# Patient Record
Sex: Male | Born: 1950 | Race: White | Hispanic: No | Marital: Married | Smoking: Former smoker
Health system: Southern US, Community
[De-identification: ages and names within clinical notes are randomized; demographics above are authoritative.]

## PROBLEM LIST (undated history)

## (undated) DIAGNOSIS — E119 Type 2 diabetes mellitus without complications: Secondary | ICD-10-CM

## (undated) DIAGNOSIS — K635 Polyp of colon: Secondary | ICD-10-CM

## (undated) DIAGNOSIS — Q613 Polycystic kidney, unspecified: Secondary | ICD-10-CM

## (undated) DIAGNOSIS — N189 Chronic kidney disease, unspecified: Secondary | ICD-10-CM

## (undated) DIAGNOSIS — K439 Ventral hernia without obstruction or gangrene: Secondary | ICD-10-CM

## (undated) DIAGNOSIS — C189 Malignant neoplasm of colon, unspecified: Secondary | ICD-10-CM

## (undated) DIAGNOSIS — I2510 Atherosclerotic heart disease of native coronary artery without angina pectoris: Secondary | ICD-10-CM

## (undated) DIAGNOSIS — I10 Essential (primary) hypertension: Secondary | ICD-10-CM

## (undated) DIAGNOSIS — E785 Hyperlipidemia, unspecified: Secondary | ICD-10-CM

## (undated) HISTORY — DX: Polycystic kidney, unspecified: Q61.3

## (undated) HISTORY — PX: TONSILLECTOMY: SUR1361

## (undated) HISTORY — PX: CORONARY ANGIOPLASTY: SHX604

## (undated) HISTORY — DX: Polyp of colon: K63.5

## (undated) HISTORY — DX: Malignant neoplasm of colon, unspecified: C18.9

## (undated) HISTORY — PX: FRACTURE SURGERY: SHX138

---

## 1997-10-01 HISTORY — PX: HERNIA REPAIR: SHX51

## 2006-06-23 ENCOUNTER — Other Ambulatory Visit: Payer: Self-pay

## 2006-06-23 ENCOUNTER — Inpatient Hospital Stay: Payer: Self-pay | Admitting: Internal Medicine

## 2007-10-31 ENCOUNTER — Other Ambulatory Visit: Payer: Self-pay

## 2007-10-31 ENCOUNTER — Emergency Department: Payer: Self-pay | Admitting: Emergency Medicine

## 2007-11-05 ENCOUNTER — Ambulatory Visit: Payer: Self-pay | Admitting: Internal Medicine

## 2008-10-01 HISTORY — PX: CORONARY ARTERY BYPASS GRAFT: SHX141

## 2008-10-03 ENCOUNTER — Inpatient Hospital Stay: Payer: Self-pay | Admitting: Internal Medicine

## 2010-11-13 ENCOUNTER — Ambulatory Visit: Payer: Self-pay | Admitting: Internal Medicine

## 2011-05-15 ENCOUNTER — Ambulatory Visit (HOSPITAL_BASED_OUTPATIENT_CLINIC_OR_DEPARTMENT_OTHER)
Admission: RE | Admit: 2011-05-15 | Discharge: 2011-05-15 | Disposition: A | Discharge status: Home / Self Care | Payer: Federal, State, Local not specified - PPO | Source: Ambulatory Visit | Attending: Orthopedic Surgery | Admitting: Orthopedic Surgery

## 2011-05-15 DIAGNOSIS — M23329 Other meniscus derangements, posterior horn of medial meniscus, unspecified knee: Secondary | ICD-10-CM | POA: Insufficient documentation

## 2011-05-15 DIAGNOSIS — E119 Type 2 diabetes mellitus without complications: Secondary | ICD-10-CM | POA: Insufficient documentation

## 2011-05-15 DIAGNOSIS — I10 Essential (primary) hypertension: Secondary | ICD-10-CM | POA: Insufficient documentation

## 2011-05-15 DIAGNOSIS — Z01812 Encounter for preprocedural laboratory examination: Secondary | ICD-10-CM | POA: Insufficient documentation

## 2011-05-15 DIAGNOSIS — M23302 Other meniscus derangements, unspecified lateral meniscus, unspecified knee: Secondary | ICD-10-CM | POA: Insufficient documentation

## 2011-05-15 LAB — POCT I-STAT, CHEM 8
BUN: 28 mg/dL — ABNORMAL HIGH (ref 6–23)
Chloride: 110 mEq/L (ref 96–112)
Creatinine, Ser: 1.6 mg/dL — ABNORMAL HIGH (ref 0.50–1.35)
Potassium: 3.9 mEq/L (ref 3.5–5.1)
Sodium: 139 mEq/L (ref 135–145)

## 2011-05-16 NOTE — Op Note (Signed)
NAMEALAZAR, PLONA             ACCOUNT NO.:  [id number]  MEDICAL RECORD NO.:  [id number]  LOCATION:                                 FACILITY:  PHYSICIAN:  Tamon Parkerson A. Noemi Chapel, M.D. DATE OF BIRTH:  12/07/1950  DATE OF PROCEDURE:  05/15/2011 DATE OF DISCHARGE:                              OPERATIVE REPORT   PREOPERATIVE DIAGNOSES: 1. Right knee medial and lateral meniscal tears with chondromalacia. 2. Right knee medial femoral condyle insufficiency fracture.  POSTOPERATIVE DIAGNOSES: 1. Right knee medial and lateral meniscal tears with chondromalacia. 2. Right knee medial femoral condyle insufficiency fracture.  PROCEDURE:  Right knee EUA followed by arthroscopic partial medial and lateral meniscectomies with chondroplasty.  SURGEON:  Audree Camel. Noemi Chapel, MD  ANESTHESIA:  General.  OPERATIVE TIME:  30 minutes.  COMPLICATIONS:  None.  INDICATIONS FOR PROCEDURE:  Mr. Klein is a 60 year old gentleman who has had 6-8 months of increasing right knee pain with exam and MRI documenting meniscal tearing with chondromalacia and medial femoral condyle insufficiency fracture.  He has failed conservative care and is now to undergo arthroscopy.  DESCRIPTION:  Mr. Mccowan was brought to the operating room on May 15, 2011, after knee block had been placed in the holding by Anesthesia. He was placed on the operative table in supine position.  He received Ancef 2 grams IV preoperatively for prophylaxis.  After being placed under general anesthesia, his right knee was examined.  He had full range of motion.  Knee was stable, ligamentous exam with normal patellar tracking.  Right leg was prepped using sterile DuraPrep and draped using sterile technique.  Time-out procedure was called and the correct right knee identified.  Initially through an anterolateral portal, the arthroscope with a pump attached was placed into an anteromedial portal and arthroscopic probe was placed.  On initial  inspection of medial compartment, he was found to have 75% grade 3 chondromalacia on the medial femoral condyle, which was debrided.  Medial tibial plateau showed grade 1 and 2 changes.  Medial meniscus showed tearing of the posterior medial horn of which 30-40% was resected back to a stable rim. Intercondylar notch was inspected.  Anterior and posterior cruciate ligaments were normal.  Lateral compartment inspected.  He had grade 1 and 2 chondromalacia.  Lateral meniscus showed 20% tear at posterolateral corner, which was resected back to a stable rim. Patellofemoral joint showed 75% grade 3 chondromalacia on the femoral groove, which was debrided.  50% grade 3 changes on the patella, which was debrided.  The patella tracked normally.  Moderate synovitis, medial lateral gutters were debrided, otherwise this was free of pathology. After this was done, it was felt that all pathology had been satisfactorily addressed.  The instruments were removed.  Portals were closed with 3-0 nylon suture.  Sterile dressings were applied and the patient awakened and taken to recovery in stable condition.  FOLLOWUP CARE:  Mr. Salaz will be followed as an outpatient on Norco for pain, touchdown weightbearing.  Seen back in the office in a week for sutures out and followup.     Brookie Wayment A. Noemi Chapel, M.D.     RAW/MEDQ  D:  05/16/2011  T:  05/16/2011  Job:  [id number]  Electronically Signed by Elsie Saas M.D. on 05/16/2011 04:54:34 PM

## 2011-11-14 ENCOUNTER — Ambulatory Visit: Payer: Self-pay | Admitting: Orthopedic Surgery

## 2012-10-01 HISTORY — PX: MENISCUS REPAIR: SHX5179

## 2013-08-10 DIAGNOSIS — I10 Essential (primary) hypertension: Secondary | ICD-10-CM | POA: Insufficient documentation

## 2013-08-10 DIAGNOSIS — N184 Chronic kidney disease, stage 4 (severe): Secondary | ICD-10-CM | POA: Insufficient documentation

## 2013-08-10 DIAGNOSIS — N186 End stage renal disease: Secondary | ICD-10-CM | POA: Insufficient documentation

## 2013-08-10 DIAGNOSIS — R809 Proteinuria, unspecified: Secondary | ICD-10-CM | POA: Insufficient documentation

## 2013-08-10 DIAGNOSIS — E1169 Type 2 diabetes mellitus with other specified complication: Secondary | ICD-10-CM | POA: Insufficient documentation

## 2013-08-10 DIAGNOSIS — E1129 Type 2 diabetes mellitus with other diabetic kidney complication: Secondary | ICD-10-CM | POA: Insufficient documentation

## 2013-09-21 DIAGNOSIS — Q612 Polycystic kidney, adult type: Secondary | ICD-10-CM | POA: Insufficient documentation

## 2014-08-23 DIAGNOSIS — N200 Calculus of kidney: Secondary | ICD-10-CM | POA: Insufficient documentation

## 2014-10-11 DIAGNOSIS — I2510 Atherosclerotic heart disease of native coronary artery without angina pectoris: Secondary | ICD-10-CM | POA: Insufficient documentation

## 2015-12-06 DIAGNOSIS — I7772 Dissection of iliac artery: Secondary | ICD-10-CM | POA: Insufficient documentation

## 2015-12-13 DIAGNOSIS — Z1211 Encounter for screening for malignant neoplasm of colon: Secondary | ICD-10-CM | POA: Insufficient documentation

## 2016-02-06 ENCOUNTER — Ambulatory Visit: Payer: Federal, State, Local not specified - PPO | Admitting: Anesthesiology

## 2016-02-06 ENCOUNTER — Ambulatory Visit
Admission: RE | Admit: 2016-02-06 | Discharge: 2016-02-06 | Disposition: A | Discharge status: Home Health | Payer: Federal, State, Local not specified - PPO | Source: Ambulatory Visit | Attending: Gastroenterology | Admitting: Gastroenterology

## 2016-02-06 ENCOUNTER — Encounter
Admission: RE | Disposition: A | Discharge status: Home Health | Payer: Self-pay | Source: Ambulatory Visit | Attending: Gastroenterology

## 2016-02-06 DIAGNOSIS — E1122 Type 2 diabetes mellitus with diabetic chronic kidney disease: Secondary | ICD-10-CM | POA: Insufficient documentation

## 2016-02-06 DIAGNOSIS — Z7982 Long term (current) use of aspirin: Secondary | ICD-10-CM | POA: Diagnosis not present

## 2016-02-06 DIAGNOSIS — K635 Polyp of colon: Secondary | ICD-10-CM

## 2016-02-06 DIAGNOSIS — Z79899 Other long term (current) drug therapy: Secondary | ICD-10-CM | POA: Diagnosis not present

## 2016-02-06 DIAGNOSIS — Z7902 Long term (current) use of antithrombotics/antiplatelets: Secondary | ICD-10-CM | POA: Diagnosis not present

## 2016-02-06 DIAGNOSIS — K5730 Diverticulosis of large intestine without perforation or abscess without bleeding: Secondary | ICD-10-CM | POA: Insufficient documentation

## 2016-02-06 DIAGNOSIS — I129 Hypertensive chronic kidney disease with stage 1 through stage 4 chronic kidney disease, or unspecified chronic kidney disease: Secondary | ICD-10-CM | POA: Insufficient documentation

## 2016-02-06 DIAGNOSIS — C189 Malignant neoplasm of colon, unspecified: Secondary | ICD-10-CM | POA: Diagnosis not present

## 2016-02-06 DIAGNOSIS — D125 Benign neoplasm of sigmoid colon: Secondary | ICD-10-CM | POA: Insufficient documentation

## 2016-02-06 DIAGNOSIS — Z794 Long term (current) use of insulin: Secondary | ICD-10-CM | POA: Insufficient documentation

## 2016-02-06 DIAGNOSIS — Z1211 Encounter for screening for malignant neoplasm of colon: Secondary | ICD-10-CM | POA: Insufficient documentation

## 2016-02-06 DIAGNOSIS — D124 Benign neoplasm of descending colon: Secondary | ICD-10-CM | POA: Insufficient documentation

## 2016-02-06 DIAGNOSIS — E785 Hyperlipidemia, unspecified: Secondary | ICD-10-CM | POA: Insufficient documentation

## 2016-02-06 DIAGNOSIS — D123 Benign neoplasm of transverse colon: Secondary | ICD-10-CM | POA: Diagnosis not present

## 2016-02-06 DIAGNOSIS — N189 Chronic kidney disease, unspecified: Secondary | ICD-10-CM | POA: Insufficient documentation

## 2016-02-06 DIAGNOSIS — I2510 Atherosclerotic heart disease of native coronary artery without angina pectoris: Secondary | ICD-10-CM | POA: Diagnosis not present

## 2016-02-06 HISTORY — DX: Atherosclerotic heart disease of native coronary artery without angina pectoris: I25.10

## 2016-02-06 HISTORY — PX: COLONOSCOPY WITH PROPOFOL: SHX5780

## 2016-02-06 HISTORY — DX: Hyperlipidemia, unspecified: E78.5

## 2016-02-06 HISTORY — DX: Type 2 diabetes mellitus without complications: E11.9

## 2016-02-06 HISTORY — DX: Malignant neoplasm of colon, unspecified: C18.9

## 2016-02-06 HISTORY — DX: Chronic kidney disease, unspecified: N18.9

## 2016-02-06 HISTORY — DX: Polyp of colon: K63.5

## 2016-02-06 HISTORY — DX: Essential (primary) hypertension: I10

## 2016-02-06 LAB — GLUCOSE, CAPILLARY: GLUCOSE-CAPILLARY: 109 mg/dL — AB (ref 65–99)

## 2016-02-06 SURGERY — COLONOSCOPY WITH PROPOFOL
Anesthesia: General

## 2016-02-06 MED ORDER — SODIUM CHLORIDE 0.9 % IV SOLN
INTRAVENOUS | Status: DC
  Administered 2016-02-06: 1000 mL via INTRAVENOUS
  Administered 2016-02-06: 12:00:00 via INTRAVENOUS

## 2016-02-06 MED ORDER — MIDAZOLAM HCL 5 MG/5ML IJ SOLN
INTRAMUSCULAR | Status: DC | PRN
  Administered 2016-02-06: 2 mg via INTRAVENOUS

## 2016-02-06 MED ORDER — PROPOFOL 500 MG/50ML IV EMUL
INTRAVENOUS | Status: DC | PRN
  Administered 2016-02-06: 200 ug/kg/min via INTRAVENOUS

## 2016-02-06 MED ORDER — SPOT INK MARKER SYRINGE KIT
PACK | SUBMUCOSAL | Status: DC | PRN
  Administered 2016-02-06: 5 mL via SUBMUCOSAL

## 2016-02-06 MED ORDER — SODIUM CHLORIDE 0.9 % IV SOLN: INTRAVENOUS | Status: DC

## 2016-02-06 MED ORDER — PROPOFOL 10 MG/ML IV BOLUS
INTRAVENOUS | Status: DC | PRN
  Administered 2016-02-06: 50 mg via INTRAVENOUS

## 2016-02-06 MED ORDER — FENTANYL CITRATE (PF) 100 MCG/2ML IJ SOLN
INTRAMUSCULAR | Status: DC | PRN
  Administered 2016-02-06: 50 ug via INTRAVENOUS

## 2016-02-06 NOTE — Anesthesia Postprocedure Evaluation (Signed)
Anesthesia Post Note  Patient: Tyrone Campbell  Procedure(s) Performed: Procedure(s) (LRB): COLONOSCOPY WITH PROPOFOL (N/A)  Patient location during evaluation: Endoscopy Anesthesia Type: General Level of consciousness: awake and alert Pain management: pain level controlled Vital Signs Assessment: post-procedure vital signs reviewed and stable Respiratory status: spontaneous breathing, nonlabored ventilation, respiratory function stable and patient connected to nasal cannula oxygen Cardiovascular status: blood pressure returned to baseline and stable Postop Assessment: no signs of nausea or vomiting Anesthetic complications: no    Last Vitals:  Filed Vitals:   02/06/16 1318 02/06/16 1328  BP: 159/84 186/96  Pulse: 57 51  Temp:    Resp: 19 24    Last Pain: There were no vitals filed for this visit.               Precious Haws Jaken Fregia

## 2016-02-06 NOTE — H&P (Signed)
Outpatient short stay form Pre-procedure 02/06/2016 11:41 AM Lollie Sails MD  Primary Physician: Dr. [name]   Reason for visit:   Colonoscopy  History of present illness:  Patient is a 65 year old male presenting today for screening colonoscopy. Since his first colonoscopy. He does have a history of taking Plavix and aspirin. He has discontinued both of those for about 5 days. He tolerated his prep well. There is no family history of colon polyps or colon cancer. He takes no other blood thinning agents or aspirin products. He does have a history of coronary artery disease with coronary artery stenting as well as polycystic kidney disease.     Current facility-administered medications:  .  0.9 %  sodium chloride infusion, , Intravenous, Continuous, Lollie Sails, MD, Last Rate: 20 mL/hr at 02/06/16 1104, 1,000 mL at 02/06/16 1104 .  0.9 %  sodium chloride infusion, , Intravenous, Continuous, Lollie Sails, MD  Prescriptions prior to admission  Medication Sig Dispense Refill Last Dose  . aspirin 81 MG tablet Take 81 mg by mouth daily.     . carvedilol (COREG) 25 MG tablet Take 25 mg by mouth 2 (two) times daily with a meal.     . cloNIDine (CATAPRES) 0.2 MG tablet Take 0.2 mg by mouth 2 (two) times daily.     . clopidogrel (PLAVIX) 75 MG tablet Take 75 mg by mouth daily.   02/01/2016 at Unknown time  . furosemide (LASIX) 20 MG tablet Take 20 mg by mouth.     Marland Kitchen glipiZIDE (GLUCOTROL) 10 MG tablet Take 20 mg by mouth 2 (two) times daily before a meal.     . insulin glargine (LANTUS) 100 UNIT/ML injection Inject 26 Units into the skin at bedtime.     . isosorbide mononitrate (IMDUR) 120 MG 24 hr tablet Take 120 mg by mouth daily.     Marland Kitchen linagliptin (TRADJENTA) 5 MG TABS tablet Take 5 mg by mouth daily.     . ramipril (ALTACE) 10 MG capsule Take 10 mg by mouth 2 (two) times daily.     . simvastatin (ZOCOR) 20 MG tablet Take 20 mg by mouth daily.        No Known  Allergies   Past Medical History  Diagnosis Date  . Coronary artery disease   . Diabetes mellitus without complication ([place])   . Hypertension   . Hyperlipemia   . Chronic kidney disease     Review of systems:      Physical Exam    Heart and lungs: Regular rate and rhythm without rub or gallop, lungs are bilaterally clear.    HEENT: Normocephalic atraumatic eyes are anicteric    Other:     Pertinant exam for procedure: Soft nontender nondistended bowel sounds are positive and normoactive.    Planned proceedures: Colonoscopy and indicated procedures. I have discussed the risks benefits and complications of procedures to include not limited to bleeding, infection, perforation and the risk of sedation and the patient wishes to proceed.    Lollie Sails, MD Gastroenterology 02/06/2016  11:41 AM

## 2016-02-06 NOTE — Anesthesia Procedure Notes (Addendum)
Date/Time: 02/06/2016 11:55 AM Performed by: Allean Found Pre-anesthesia Checklist: Patient identified, Emergency Drugs available, Suction available, Patient being monitored and Timeout performed Patient Re-evaluated:Patient Re-evaluated prior to inductionOxygen Delivery Method: Nasal cannula Intubation Type: IV induction   Performed by: Starlee Corralejo Ventilation: Nasal airway inserted- appropriate to patient size

## 2016-02-06 NOTE — Transfer of Care (Signed)
Immediate Anesthesia Transfer of Care Note  Patient: Tyrone Campbell  Procedure(s) Performed: Procedure(s): COLONOSCOPY WITH PROPOFOL (N/A)  Patient Location: PACU  Anesthesia Type:General  Level of Consciousness: awake  Airway & Oxygen Therapy: Patient Spontanous Breathing and Patient connected to nasal cannula oxygen  Post-op Assessment: Report given to RN and Post -op Vital signs reviewed and stable  Post vital signs: Reviewed and stable  Last Vitals:  Filed Vitals:   02/06/16 1044 02/06/16 1258  BP: 208/90   Pulse: 56 73  Temp: 36.4 C 36.2 C  Resp: 18     Last Pain: There were no vitals filed for this visit.       Complications: No apparent anesthesia complications

## 2016-02-06 NOTE — Anesthesia Preprocedure Evaluation (Signed)
Anesthesia Evaluation  Patient identified by MRN, date of birth, ID band Patient awake    Reviewed: Allergy & Precautions, H&P , NPO status , Patient's Chart, lab work & pertinent test results  History of Anesthesia Complications Negative for: history of anesthetic complications  Airway Mallampati: III  TM Distance: >3 FB Neck ROM: limited    Dental  (+) Poor Dentition, Chipped, Missing, Partial Upper   Pulmonary Recent URI , Residual Cough, former smoker,    Pulmonary exam normal breath sounds clear to auscultation       Cardiovascular Exercise Tolerance: Good hypertension, (-) angina+ CAD and + Cardiac Stents  (-) DOE Normal cardiovascular exam Rhythm:regular Rate:Normal     Neuro/Psych negative neurological ROS  negative psych ROS   GI/Hepatic negative GI ROS, Neg liver ROS,   Endo/Other  diabetes, Type 2, Insulin Dependent  Renal/GU CRFRenal disease  negative genitourinary   Musculoskeletal   Abdominal   Peds  Hematology negative hematology ROS (+)   Anesthesia Other Findings Past Medical History:   Coronary artery disease                                      Diabetes mellitus without complication (HCC)                 Hypertension                                                 Hyperlipemia                                                 Chronic kidney disease                                      Past Surgical History:   MENISCUS REPAIR                                 Right              CORONARY ANGIOPLASTY                                          CORONARY ARTERY BYPASS GRAFT                                  FRACTURE SURGERY                                              TONSILLECTOMY  HERNIA REPAIR                                                BMI    Body Mass Index   35.99 kg/m 2      Reproductive/Obstetrics negative OB ROS                              Anesthesia Physical Anesthesia Plan  ASA: III  Anesthesia Plan: General   Post-op Pain Management:    Induction:   Airway Management Planned:   Additional Equipment:   Intra-op Plan:   Post-operative Plan:   Informed Consent: I have reviewed the patients History and Physical, chart, labs and discussed the procedure including the risks, benefits and alternatives for the proposed anesthesia with the patient or authorized representative who has indicated his/her understanding and acceptance.   Dental Advisory Given  Plan Discussed with: Anesthesiologist, CRNA and Surgeon  Anesthesia Plan Comments:         Anesthesia Quick Evaluation

## 2016-02-06 NOTE — Op Note (Signed)
[facility] Gastroenterology Patient Name: Tyrone Campbell Procedure Date: 02/06/2016 11:46 AM MRN: [id number] Account #: [id number] Date of Birth: May 21, 1951 Admit Type: Outpatient Age: 65 Room: [facility] ENDO ROOM 3 Gender: Male Note Status: Finalized Procedure:            Colonoscopy Indications:          Screening for colorectal malignant neoplasm Providers:            Lollie Sails, MD Referring MD:         [name] (Referring MD) Medicines:            Monitored Anesthesia Care Complications:        No immediate complications. Procedure:            Pre-Anesthesia Assessment:                       - ASA Grade Assessment: III - A patient with severe                        systemic disease.                       After obtaining informed consent, the colonoscope was                        passed under direct vision. Throughout the procedure,                        the patient's blood pressure, pulse, and oxygen                        saturations were monitored continuously. The                        Colonoscope was introduced through the anus and                        advanced to the the cecum, identified by appendiceal                        orifice and ileocecal valve. The colonoscopy was                        performed with moderate difficulty due to a tortuous                        colon and the patient's body habitus. The patient                        tolerated the procedure well. The quality of the bowel                        preparation was fair. Findings:      A 10 mm polyp was found in the splenic flexure. The polyp was sessile.       The polyp was removed with a cold snare. Resection and retrieval were       complete.      A 5 mm polyp was found in the distal transverse colon. The polyp was       sessile. The polyp was removed with a cold snare.  Resection and       retrieval were complete.      A 8 mm polyp was found in the distal  descending colon. The polyp was       pedunculated. The polyp was removed with a hot snare. Resection and       retrieval were complete. To prevent bleeding after the polypectomy, one       hemostatic clip was successfully placed. There was no bleeding at the       end of the maneuver.      A fungating non-obstructing about 25 mm (circumferential) x63mm       (longitudinal) flat mass was found at 16 cm proximal to the anus. The       mass was non-circumferential. No bleeding was present. Biopsies were       taken with a cold forceps for histology. Area was tattooed with an       injection of 3 mL of [place] ink.      The retroflexed view of the distal rectum and anal verge was normal and       showed no anal or rectal abnormalities.      The digital rectal exam was normal. Impression:           - Preparation of the colon was fair.                       - One 10 mm polyp at the splenic flexure, removed with                        a cold snare. Resected and retrieved.                       - One 5 mm polyp in the distal transverse colon,                        removed with a cold snare. Resected and retrieved.                       - One 8 mm polyp in the distal descending colon,                        removed with a hot snare. Resected and retrieved. Clip                        was placed.                       - Rule out malignancy, tumor at 16 cm proximal to the                        anus. Biopsied.                       - The distal rectum and anal verge are normal on                        retroflexion view. Recommendation:       - Discharge patient to home.                       - Await pathology results. Procedure Code(s):    ---  Professional ---                       [id number], Colonoscopy, flexible; with removal of tumor(s),                        polyp(s), or other lesion(s) by snare technique                       [id number], Colonoscopy, flexible; with directed submucosal                         injection(s), any substance                       [id number], 59, Colonoscopy, flexible; with biopsy, single                        or multiple Diagnosis Code(s):    --- Professional ---                       Z12.11, Encounter for screening for malignant neoplasm                        of colon                       D12.3, Benign neoplasm of transverse colon (hepatic                        flexure or splenic flexure)                       D12.4, Benign neoplasm of descending colon                       D49.0, Neoplasm of unspecified behavior of digestive                        system CPT copyright 2016 American Medical [employer]. All rights reserved. The codes documented in this report are preliminary and upon coder review may  be revised to meet current compliance requirements. Lollie Sails, MD 02/06/2016 12:58:02 PM This report has been signed electronically. Number of Addenda: 0 Note Initiated On: 02/06/2016 11:46 AM Scope Withdrawal Time: 0 hours 33 minutes 32 seconds  Total Procedure Duration: 0 hours 50 minutes 16 seconds       [facility]

## 2016-02-07 ENCOUNTER — Encounter: Payer: Self-pay | Admitting: Gastroenterology

## 2016-02-07 LAB — SURGICAL PATHOLOGY

## 2016-02-09 ENCOUNTER — Other Ambulatory Visit: Payer: Self-pay | Admitting: Gastroenterology

## 2016-02-09 ENCOUNTER — Encounter: Payer: Self-pay | Admitting: *Deleted

## 2016-02-09 DIAGNOSIS — C189 Malignant neoplasm of colon, unspecified: Secondary | ICD-10-CM

## 2016-02-16 ENCOUNTER — Ambulatory Visit
Admission: RE | Admit: 2016-02-16 | Discharge: 2016-02-16 | Disposition: A | Discharge status: Home / Self Care | Payer: Federal, State, Local not specified - PPO | Source: Ambulatory Visit | Attending: Gastroenterology | Admitting: Gastroenterology

## 2016-02-16 DIAGNOSIS — N200 Calculus of kidney: Secondary | ICD-10-CM | POA: Insufficient documentation

## 2016-02-16 DIAGNOSIS — Q613 Polycystic kidney, unspecified: Secondary | ICD-10-CM | POA: Insufficient documentation

## 2016-02-16 DIAGNOSIS — K5730 Diverticulosis of large intestine without perforation or abscess without bleeding: Secondary | ICD-10-CM | POA: Diagnosis not present

## 2016-02-16 DIAGNOSIS — K769 Liver disease, unspecified: Secondary | ICD-10-CM | POA: Diagnosis not present

## 2016-02-16 DIAGNOSIS — C189 Malignant neoplasm of colon, unspecified: Secondary | ICD-10-CM | POA: Diagnosis present

## 2016-02-16 DIAGNOSIS — R918 Other nonspecific abnormal finding of lung field: Secondary | ICD-10-CM | POA: Diagnosis not present

## 2016-02-21 ENCOUNTER — Encounter: Payer: Self-pay | Admitting: General Surgery

## 2016-02-22 ENCOUNTER — Ambulatory Visit (INDEPENDENT_AMBULATORY_CARE_PROVIDER_SITE_OTHER): Payer: Federal, State, Local not specified - PPO | Admitting: General Surgery

## 2016-02-22 ENCOUNTER — Telehealth: Payer: Self-pay

## 2016-02-22 ENCOUNTER — Encounter: Payer: Self-pay | Admitting: General Surgery

## 2016-02-22 VITALS — BP 128/88 | HR 64 | Resp 16 | Ht 71.0 in | Wt 262.0 lb

## 2016-02-22 DIAGNOSIS — Q613 Polycystic kidney, unspecified: Secondary | ICD-10-CM | POA: Diagnosis not present

## 2016-02-22 DIAGNOSIS — C189 Malignant neoplasm of colon, unspecified: Secondary | ICD-10-CM

## 2016-02-22 DIAGNOSIS — E669 Obesity, unspecified: Secondary | ICD-10-CM | POA: Diagnosis not present

## 2016-02-22 NOTE — Telephone Encounter (Signed)
  Oncology Nurse Navigator Documentation  Navigator Location: [facility] (02/22/16 1700) Navigator Encounter Type: Introductory phone call;Telephone (02/22/16 1700) Telephone: Outgoing Call (02/22/16 1700)             Barriers/Navigation Needs: Coordination of Care (02/22/16 1700)   Interventions: Coordination of Care (02/22/16 1700)   Coordination of Care: EUS (02/22/16 1700)        Acuity: Level 2 (02/22/16 1700)   Acuity Level 2: Initial guidance, education and coordination as needed;Educational needs;Assistance expediting appointments;Ongoing guidance and education throughout treatment as needed (02/22/16 1700)     Time Spent with Patient: 30 (02/22/16 1700)   Received referral from Dr Bary Castilla for lower EUS for colorectal adenocarcinoma. Needs before he goes out of town 6-6. Not available at [facility] until after that date. Patient is agreeable to have procedure at [facility] to expedite. Information sent and [facility] will contact him directly for date/time/instructions.

## 2016-02-22 NOTE — Patient Instructions (Addendum)
The patient is aware to call back for any questions or concerns. Schedule an Endorectal ultrasound

## 2016-02-22 NOTE — Progress Notes (Signed)
Patient ID: Tyrone Campbell, male   DOB: [birth year], 65 y.o.   MRN: [id number]  Chief Complaint  Patient presents with  . Other    colon mass    HPI Tyrone Campbell is a 65 y.o. male here today for a evaluation of a colon mass. This colonoscopy was in preparation for going on the kidney transplant list. The only colonoscopy that has been done was on 02/06/16 by Dr Gustavo Lah. He denies any gastrointestinal issues prior to the colonoscopy. He anticipates going on the transplant list at [facility]. He states he has had a stress test as well and his blood pressure elevated. He has recently lost almost 20 pounds with exercise and diet control. He retired after recent diagnosis of colon cancer he had also retired from [employer].  The patient was last seen here in December 1998 when he underwent an umbilical hernia repair with placement of Marlex mesh.    CT was 02-16-16. He is planning on going to [place] next month, he returns on June 22.  He is here today with his wife, Brantley Stage). His sister is Vilma Prader.  I personally reviewed the patient's history.  HPI  Past Medical History  Diagnosis Date  . Coronary artery disease   . Diabetes mellitus without complication ([place])   . Hypertension   . Hyperlipemia   . Chronic kidney disease     stage 4  . Polycystic kidney disease   . Colon polyp 02-06-16    TUBULAR ADENOMA and INVASIVE COLORECTAL ADENOCARCINOMA, MODERATELY DIFFERENTIATED  . Colon cancer ([place]) 02-06-16    INVASIVE COLORECTAL ADENOCARCINOMA, MODERATELY DIFFERENTIATED    Past Surgical History  Procedure Laterality Date  . Meniscus repair Right 2014  . Coronary angioplasty    . Coronary artery bypass graft  Jan 2010    [facility]  . Fracture surgery    . Tonsillectomy    . Hernia repair  1999    Dr Bary Castilla  . Colonoscopy with propofol N/A 02/06/2016    Procedure: COLONOSCOPY WITH PROPOFOL;  Surgeon: Lollie Sails, MD;  Location: [facility] ENDOSCOPY;  Service: Endoscopy;   Laterality: N/A;    Family History  Problem Relation Age of Onset  . Stroke Father   . Polycystic kidney disease Mother     Social History Social History  Substance Use Topics  . Smoking status: Former Research scientist (life sciences)  . Smokeless tobacco: Never Used  . Alcohol Use: No    No Known Allergies  Current Outpatient Prescriptions  Medication Sig Dispense Refill  . albuterol (PROVENTIL HFA;VENTOLIN HFA) 108 (90 Base) MCG/ACT inhaler Inhale into the lungs every 6 (six) hours as needed for wheezing or shortness of breath.    Marland Kitchen aspirin 81 MG tablet Take 81 mg by mouth daily.    . carvedilol (COREG) 25 MG tablet Take 25 mg by mouth 2 (two) times daily with a meal.    . cloNIDine (CATAPRES) 0.2 MG tablet Take 0.2 mg by mouth 2 (two) times daily.    . clopidogrel (PLAVIX) 75 MG tablet Take 75 mg by mouth daily.    . furosemide (LASIX) 20 MG tablet Take 20 mg by mouth.    Marland Kitchen glipiZIDE (GLUCOTROL) 10 MG tablet Take 20 mg by mouth 2 (two) times daily before a meal.    . insulin glargine (LANTUS) 100 UNIT/ML injection Inject 26 Units into the skin at bedtime.    . isosorbide mononitrate (IMDUR) 120 MG 24 hr tablet Take 120 mg by mouth daily.    Marland Kitchen  linagliptin (TRADJENTA) 5 MG TABS tablet Take 5 mg by mouth daily.    . ramipril (ALTACE) 10 MG capsule Take 10 mg by mouth 2 (two) times daily.    . simvastatin (ZOCOR) 20 MG tablet Take 20 mg by mouth daily.    Marland Kitchen triamcinolone cream (KENALOG) 0.1 % Apply 1 application topically as needed.     No current facility-administered medications for this visit.    Review of Systems Review of Systems  Constitutional: Negative.   Respiratory: Negative.   Cardiovascular: Negative.     Blood pressure 128/88, pulse 64, resp. rate 16, height 5\' 11"  (1.803 m), weight 262 lb (118.842 kg).  Physical Exam Physical Exam  Constitutional: He is oriented to person, place, and time. He appears well-developed and well-nourished.  HENT:  Mouth/Throat: Oropharynx is clear  and moist.  Eyes: Conjunctivae are normal. No scleral icterus.  Neck: Neck supple.  Cardiovascular: Normal rate, regular rhythm and normal heart sounds.   Mild lower leg edema.  Pulmonary/Chest: Effort normal and breath sounds normal.  Abdominal: Soft. Normal appearance and bowel sounds are normal. There is no tenderness. No hernia.  diastasis recti present.  Lymphadenopathy:    He has no cervical adenopathy.  Neurological: He is alert and oriented to person, place, and time.  Skin: Skin is warm and dry.  Psychiatric: His behavior is normal.    Data Reviewed 02/06/2016 colonoscopy report and images reviewed. Polypoid mass reported at 16 cm encompassing approximately 40% of the bowel wall lumen. Biopsy showed intermediate grade, moderately differentiated invasive adenocarcinoma.  GI notes completed by Ronney Asters, PA dated 02/09/2016 reviewed. A 5 x 35 mm flat polyp identified 16 cm from the anus with invasive adenocarcinoma on biopsy. Procedure was completed as part a screening exam for transplant.  This Constance Haw was in contact with the [facility] nephrology transplant coordinator Derl Barrow who reported the transplant would likely be delayed to-5 years based on the recent identification of adenocarcinoma colon. The patient is scheduled for a final cardiac evaluation at [facility], originally as part of the transplant workup. The patient is encouraged to complete this evaluation as cardiac evaluation prior to major surgery will be necessary..   CT scan of the chest, abdomen and pelvis dated 02/16/2016 were reviewed.  Most notable findings are massively enlarged kidneys, 15 x 20 cm bilaterally with multiple cysts. Multiple low attenuating lesions within the liver consistent with cysts, cannot rule out subtle metastatic lesions. Right adrenal cyst. Multiple colonic diverticuli. No mention of bowel wall thickening in the area of reported tumor or significant adenopathy.  These films were independently  reviewed. There is a small area to the right of the mid rectum which is not of a typical appearance for adenopathy. Wall thickening not noted.  Metabolic panel obtained in December 1998 showed a creatinine of 1.1. Present creatinine 3.5.     Assessment    Adenocarcinoma of the upper rectum.  Grade 4 chronic renal disease secondary to polycystic kidneys. Evidence of polycystic liver disease.  Obesity (reported recent 25 pound weight loss), BMI 35.6.Marland Kitchen    Plan    I spoke in formerly with medical oncology. The patient would not be a candidate for a proximal aloe platinum as part of her neoadjuvant chemotherapy regimen based on his renal disease, would be a candidate for 5-FU. Endorectal ultrasound is indicated to determine if this is a T2 versus T3 lesion, and to identify if there is evidence of local nodal disease which would indicate to fit  from neoadjuvant treatment..    The patient has been encouraged to complete the cardiac evaluation presently scheduled at [facility] based on his 2010 stent placement and the stresses associated with surgical resection of the rectum.  The patient was advised that once all the pertinent data has been obtained, we may need to discuss whether his surgery would be best managed at a university setting where dialysis was readily available if his renal function becomes more compromised in the postoperative time frame and should robotic surgery be more appropriate based on his BMI and the tumor location.  The family has a trip to [place] scheduled in mid June. They've been encouraged to take this. We'll try to get his endorectal ultrasound completed prior to the June 7 departure date.  Schedule an Endorectal ultrasound  PCP:  Leonel Ramsay This information has been scribed by Gaspar Cola CMA.    Robert Bellow 02/23/2016, 11:59 AM

## 2016-02-23 DIAGNOSIS — E669 Obesity, unspecified: Secondary | ICD-10-CM | POA: Insufficient documentation

## 2016-02-23 DIAGNOSIS — C189 Malignant neoplasm of colon, unspecified: Secondary | ICD-10-CM | POA: Insufficient documentation

## 2016-02-23 DIAGNOSIS — Q613 Polycystic kidney, unspecified: Secondary | ICD-10-CM | POA: Insufficient documentation

## 2016-02-28 NOTE — Progress Notes (Signed)
  Oncology Nurse Navigator Documentation  Navigator Location: [facility] (02/28/16 1700)                                            Time Spent with Patient: 15 (02/28/16 1700)   EUS has been scheduled for 03/01/16 with Dr Jowell 

## 2016-02-29 ENCOUNTER — Encounter: Payer: Self-pay | Admitting: General Surgery

## 2016-03-01 HISTORY — PX: OTHER SURGICAL HISTORY: SHX169

## 2016-03-01 NOTE — Progress Notes (Signed)
Thank you Martin.

## 2016-04-06 ENCOUNTER — Encounter: Payer: Self-pay | Admitting: General Surgery

## 2016-04-09 ENCOUNTER — Telehealth: Payer: Self-pay | Admitting: *Deleted

## 2016-04-09 NOTE — Telephone Encounter (Signed)
Per Lenna Sciara at [facility] (Phone: [phone]: [phone]), patient saw Dr. Donia Ast on 04-04-16.   I have requested a copy of records from that office visit be forwarded to our office.

## 2016-04-09 NOTE — Telephone Encounter (Signed)
-----   Message from Robert Bellow, MD sent at 03/01/2016  2:18 PM EDT ----- Please contact Donia Ast, MD at [facility] and arrange an appointment.  65 y./o male with mid rectal, T1 cancer. He is going to [place] Jun 7-25.  APPT after 7/25. See if they can contact him before 6/7 to schedule appt.  Thanks.

## 2016-04-30 DIAGNOSIS — C189 Malignant neoplasm of colon, unspecified: Secondary | ICD-10-CM

## 2016-06-26 DIAGNOSIS — Z939 Artificial opening status, unspecified: Secondary | ICD-10-CM | POA: Insufficient documentation

## 2016-06-26 DIAGNOSIS — Z85048 Personal history of other malignant neoplasm of rectum, rectosigmoid junction, and anus: Secondary | ICD-10-CM | POA: Insufficient documentation

## 2017-07-16 ENCOUNTER — Encounter: Payer: Self-pay | Admitting: *Deleted

## 2017-07-17 ENCOUNTER — Ambulatory Visit: Payer: Federal, State, Local not specified - PPO | Admitting: Anesthesiology

## 2017-07-17 ENCOUNTER — Ambulatory Visit
Admission: RE | Admit: 2017-07-17 | Discharge: 2017-07-17 | Disposition: A | Discharge status: Home / Self Care | Payer: Federal, State, Local not specified - PPO | Source: Ambulatory Visit | Attending: Internal Medicine | Admitting: Internal Medicine

## 2017-07-17 ENCOUNTER — Encounter
Admission: RE | Disposition: A | Discharge status: Home / Self Care | Payer: Self-pay | Source: Ambulatory Visit | Attending: Internal Medicine

## 2017-07-17 DIAGNOSIS — Z980 Intestinal bypass and anastomosis status: Secondary | ICD-10-CM | POA: Insufficient documentation

## 2017-07-17 DIAGNOSIS — Z1211 Encounter for screening for malignant neoplasm of colon: Secondary | ICD-10-CM | POA: Insufficient documentation

## 2017-07-17 DIAGNOSIS — I2510 Atherosclerotic heart disease of native coronary artery without angina pectoris: Secondary | ICD-10-CM | POA: Diagnosis not present

## 2017-07-17 DIAGNOSIS — Z87891 Personal history of nicotine dependence: Secondary | ICD-10-CM | POA: Diagnosis not present

## 2017-07-17 DIAGNOSIS — Z85038 Personal history of other malignant neoplasm of large intestine: Secondary | ICD-10-CM | POA: Insufficient documentation

## 2017-07-17 DIAGNOSIS — E785 Hyperlipidemia, unspecified: Secondary | ICD-10-CM | POA: Diagnosis not present

## 2017-07-17 DIAGNOSIS — E1122 Type 2 diabetes mellitus with diabetic chronic kidney disease: Secondary | ICD-10-CM | POA: Diagnosis not present

## 2017-07-17 DIAGNOSIS — Z7902 Long term (current) use of antithrombotics/antiplatelets: Secondary | ICD-10-CM | POA: Insufficient documentation

## 2017-07-17 DIAGNOSIS — K640 First degree hemorrhoids: Secondary | ICD-10-CM | POA: Diagnosis not present

## 2017-07-17 DIAGNOSIS — N184 Chronic kidney disease, stage 4 (severe): Secondary | ICD-10-CM | POA: Diagnosis not present

## 2017-07-17 DIAGNOSIS — Z79899 Other long term (current) drug therapy: Secondary | ICD-10-CM | POA: Insufficient documentation

## 2017-07-17 DIAGNOSIS — Z6836 Body mass index (BMI) 36.0-36.9, adult: Secondary | ICD-10-CM | POA: Insufficient documentation

## 2017-07-17 DIAGNOSIS — Z7982 Long term (current) use of aspirin: Secondary | ICD-10-CM | POA: Insufficient documentation

## 2017-07-17 DIAGNOSIS — Q613 Polycystic kidney, unspecified: Secondary | ICD-10-CM | POA: Insufficient documentation

## 2017-07-17 DIAGNOSIS — Z794 Long term (current) use of insulin: Secondary | ICD-10-CM | POA: Diagnosis not present

## 2017-07-17 DIAGNOSIS — K5730 Diverticulosis of large intestine without perforation or abscess without bleeding: Secondary | ICD-10-CM | POA: Insufficient documentation

## 2017-07-17 DIAGNOSIS — I129 Hypertensive chronic kidney disease with stage 1 through stage 4 chronic kidney disease, or unspecified chronic kidney disease: Secondary | ICD-10-CM | POA: Insufficient documentation

## 2017-07-17 HISTORY — PX: COLONOSCOPY WITH PROPOFOL: SHX5780

## 2017-07-17 LAB — GLUCOSE, CAPILLARY: GLUCOSE-CAPILLARY: 115 mg/dL — AB (ref 65–99)

## 2017-07-17 SURGERY — COLONOSCOPY WITH PROPOFOL
Anesthesia: General

## 2017-07-17 MED ORDER — SODIUM CHLORIDE 0.9 % IV SOLN
INTRAVENOUS | Status: DC
  Administered 2017-07-17: 1000 mL via INTRAVENOUS

## 2017-07-17 MED ORDER — PROPOFOL 500 MG/50ML IV EMUL
INTRAVENOUS | Status: DC | PRN
  Administered 2017-07-17: 125 ug/kg/min via INTRAVENOUS

## 2017-07-17 MED ORDER — LIDOCAINE HCL (PF) 1 % IJ SOLN
INTRAMUSCULAR | Status: AC
  Administered 2017-07-17: 0.3 mL via INTRADERMAL
  Filled 2017-07-17: qty 2

## 2017-07-17 MED ORDER — LIDOCAINE HCL (PF) 1 % IJ SOLN
2.0000 mL | Freq: Once | INTRAMUSCULAR | Status: AC
  Administered 2017-07-17: 0.3 mL via INTRADERMAL

## 2017-07-17 MED ORDER — PROPOFOL 10 MG/ML IV BOLUS
INTRAVENOUS | Status: DC | PRN
  Administered 2017-07-17: 100 mg via INTRAVENOUS

## 2017-07-17 MED ORDER — PROPOFOL 500 MG/50ML IV EMUL
INTRAVENOUS | Status: AC
  Filled 2017-07-17: qty 50

## 2017-07-17 NOTE — Anesthesia Postprocedure Evaluation (Signed)
Anesthesia Post Note  Patient: Tyrone Campbell  Procedure(s) Performed: COLONOSCOPY WITH PROPOFOL (N/A )  Patient location during evaluation: Endoscopy Anesthesia Type: General Level of consciousness: awake and alert Pain management: pain level controlled Vital Signs Assessment: post-procedure vital signs reviewed and stable Respiratory status: spontaneous breathing and respiratory function stable Cardiovascular status: stable Anesthetic complications: no     Last Vitals:  Vitals:   07/17/17 0929 07/17/17 0939  BP: 105/75   Pulse: 77 76  Resp: 18 17  Temp:    SpO2: 96% 98%    Last Pain:  Vitals:   07/17/17 0909  TempSrc: Tympanic                 Psalm Schappell K

## 2017-07-17 NOTE — Anesthesia Procedure Notes (Signed)
Date/Time: 07/17/2017 8:56 AM Performed by: Nelda Marseille Pre-anesthesia Checklist: Patient identified, Emergency Drugs available, Suction available, Patient being monitored and Timeout performed Oxygen Delivery Method: Nasal cannula

## 2017-07-17 NOTE — H&P (Signed)
Outpatient short stay form Pre-procedure 07/17/2017 8:43 AM Nakshatra Klose K. Alice Reichert, M.D.  Primary Physician: [name], M.D.  Reason for visit:  Personal hx of colon cancer   History of present illness:  Pt is a pleasant 66 year old male with a personal history of colon cancer of the rectum status post lower anterior resection, loop colostomy around May 2017, status post colostomy/ileostomy takedown in September 2017. Patient has some loose stools with some increased frequency but down from his initial postoperative stooling. Denies any hematochezia or weight loss. Reportedly the serum carcinoembryonic antigen level was normal. He is here for 1 year surveillance.    Current Facility-Administered Medications:  .  0.9 %  sodium chloride infusion, , Intravenous, Continuous, [place], Benay Pike, MD, Last Rate: 20 mL/hr at 07/17/17 0817, 1,000 mL at 07/17/17 8315  Prescriptions Prior to Admission  Medication Sig Dispense Refill Last Dose  . Alogliptin Benzoate 12.5 MG TABS Take 6.25 mg by mouth daily.     . tamsulosin (FLOMAX) 0.4 MG CAPS capsule Take 0.4 mg by mouth 2 (two) times daily.     Marland Kitchen albuterol (PROVENTIL HFA;VENTOLIN HFA) 108 (90 Base) MCG/ACT inhaler Inhale into the lungs every 6 (six) hours as needed for wheezing or shortness of breath.     Marland Kitchen aspirin 81 MG tablet Take 81 mg by mouth daily.   07/10/2017  . carvedilol (COREG) 25 MG tablet Take 25 mg by mouth 2 (two) times daily with a meal.   07/17/2017  . cloNIDine (CATAPRES) 0.2 MG tablet Take 0.2 mg by mouth 2 (two) times daily.   07/17/2017  . clopidogrel (PLAVIX) 75 MG tablet Take 75 mg by mouth daily.   07/10/2017  . furosemide (LASIX) 20 MG tablet Take 20 mg by mouth.   Taking  . glipiZIDE (GLUCOTROL) 10 MG tablet Take 20 mg by mouth 2 (two) times daily before a meal.   Taking  . insulin glargine (LANTUS) 100 UNIT/ML injection Inject 26 Units into the skin at bedtime.   Taking  . isosorbide mononitrate (IMDUR) 120 MG 24 hr  tablet Take 120 mg by mouth daily.   Taking  . linagliptin (TRADJENTA) 5 MG TABS tablet Take 5 mg by mouth daily.   Taking  . ramipril (ALTACE) 10 MG capsule Take 10 mg by mouth 2 (two) times daily.   07/17/2017  . simvastatin (ZOCOR) 20 MG tablet Take 20 mg by mouth daily.   Taking  . triamcinolone cream (KENALOG) 0.1 % Apply 1 application topically as needed.        No Known Allergies   Past Medical History:  Diagnosis Date  . Chronic kidney disease    stage 4  . Colon cancer ([place]) 02-06-16   INVASIVE COLORECTAL ADENOCARCINOMA, MODERATELY DIFFERENTIATED  . Colon polyp 02-06-16   TUBULAR ADENOMA and INVASIVE COLORECTAL ADENOCARCINOMA, MODERATELY DIFFERENTIATED  . Coronary artery disease   . Diabetes mellitus without complication ([place])   . Hyperlipemia   . Hypertension   . Polycystic kidney disease     Review of systems:      Physical Exam  General appearance: alert, cooperative and appears stated age Resp: clear to auscultation bilaterally Cardio: regular rate and rhythm, S1, S2 normal, no murmur, click, rub or gallop GI: soft, non-tender; bowel sounds normal; no masses,  no organomegaly     Planned procedures: Proceed with colonoscopy. The patient understands the nature of the planned procedure, indications, risks, alternatives and potential complications including but not limited to bleeding, infection, perforation, damage to  internal organs and possible oversedation/side effects from anesthesia. The patient agrees and gives consent to proceed.  Please refer to procedure notes for findings, recommendations and patient disposition/instructions.    Ladaja Yusupov K. Alice Reichert, M.D. Gastroenterology 07/17/2017  8:43 AM

## 2017-07-17 NOTE — Op Note (Signed)
[facility] Gastroenterology Patient Name: Tyrone Campbell Procedure Date: 07/17/2017 7:44 AM MRN: [id number] Account #: [id number] Date of Birth: 06/21/1951 Admit Type: Outpatient Age: 66 Room: [facility] ENDO ROOM 3 Gender: Male Note Status: Finalized Procedure:            Colonoscopy Indications:          High risk colon cancer surveillance: Personal history                        of colon cancer, Last colonoscopy: May 2017 Providers:            Benay Pike. Alice Reichert MD, MD Referring MD:         [name] (Referring MD) Medicines:            Propofol per Anesthesia Complications:        No immediate complications. Procedure:            Pre-Anesthesia Assessment:                       - The risks and benefits of the procedure and the                        sedation options and risks were discussed with the                        patient. All questions were answered and informed                        consent was obtained.                       - Patient identification and proposed procedure were                        verified prior to the procedure by the nurse. The                        procedure was verified in the endoscopy suite.                       - ASA Grade Assessment: III - A patient with severe                        systemic disease.                       - After reviewing the risks and benefits, the patient                        was deemed in satisfactory condition to undergo the                        procedure.                       After obtaining informed consent, the colonoscope was                        passed under direct vision. Throughout the procedure,  the patient's blood pressure, pulse, and oxygen                        saturations were monitored continuously. The                        Colonoscope was introduced through the anus and                        advanced to the the terminal ileum, with identification                         of the appendiceal orifice and IC valve. The                        colonoscopy was performed without difficulty. The                        patient tolerated the procedure well. The quality of                        the bowel preparation was good. Findings:      The perianal and digital rectal examinations were normal. Pertinent       negatives include normal sphincter tone and no palpable rectal lesions.      Multiple small and large-mouthed diverticula were found in the left       colon. There was no evidence of diverticular bleeding.      There was evidence of a prior end-to-side colo-colonic anastomosis in       the sigmoid colon. This was patent and was characterized by healthy       appearing mucosa.      Non-bleeding internal hemorrhoids were found during retroflexion. The       hemorrhoids were Grade I (internal hemorrhoids that do not prolapse).      The exam was otherwise without abnormality.      The terminal ileum appeared normal. Impression:           - Moderate diverticulosis in the left colon. There was                        no evidence of diverticular bleeding.                       - Patent end-to-side colo-colonic anastomosis,                        characterized by healthy appearing mucosa.                       - Non-bleeding internal hemorrhoids.                       - The examination was otherwise normal.                       - No specimens collected. Recommendation:       - Patient has a contact number available for                        emergencies. The signs and symptoms of potential  delayed complications were discussed with the patient.                        Return to normal activities tomorrow. Written discharge                        instructions were provided to the patient.                       - Resume previous diet.                       - Continue present medications.                       - Repeat  colonoscopy in 1 year for surveillance.                       - Return to GI office PRN.                       - The findings and recommendations were discussed with                        the patient.                       - The findings and recommendations were discussed with                        the patient's family. Procedure Code(s):    --- Professional ---                       E7035, Colorectal cancer screening; colonoscopy on                        individual at high risk Diagnosis Code(s):    --- Professional ---                       [phone], Personal history of other malignant neoplasm                        of large intestine                       K64.0, First degree hemorrhoids                       Z98.0, Intestinal bypass and anastomosis status                       K57.30, Diverticulosis of large intestine without                        perforation or abscess without bleeding CPT copyright 2016 American Medical Association. All rights reserved. The codes documented in this report are preliminary and upon coder review may  be revised to meet current compliance requirements. Efrain Sella MD, MD 07/17/2017 9:12:41 AM This report has been signed electronically. Number of Addenda: 0 Note Initiated On: 07/17/2017 7:44 AM Scope Withdrawal Time: 0 hours 4 minutes 49 seconds  Total Procedure Duration: 0 hours 13 minutes 16 seconds  [facility]

## 2017-07-17 NOTE — Transfer of Care (Signed)
Immediate Anesthesia Transfer of Care Note  Patient: Tyrone Campbell  Procedure(s) Performed: COLONOSCOPY WITH PROPOFOL (N/A )  Patient Location: PACU  Anesthesia Type:General  Level of Consciousness: sedated  Airway & Oxygen Therapy: Patient Spontanous Breathing and Patient connected to nasal cannula oxygen  Post-op Assessment: Report given to RN and Post -op Vital signs reviewed and stable  Post vital signs: Reviewed and stable  Last Vitals:  Vitals:   07/17/17 0759  BP: (!) 180/100  Pulse: 72  Resp: 17  Temp: 37.3 C  SpO2: 100%    Last Pain:  Vitals:   07/17/17 0759  TempSrc: Tympanic         Complications: No apparent anesthesia complications

## 2017-07-17 NOTE — Anesthesia Post-op Follow-up Note (Signed)
Anesthesia QCDR form completed.        

## 2017-07-17 NOTE — Anesthesia Preprocedure Evaluation (Signed)
Anesthesia Evaluation  Patient identified by MRN, date of birth, ID band Patient awake    Reviewed: Allergy & Precautions, NPO status , Patient's Chart, lab work & pertinent test results  History of Anesthesia Complications Negative for: history of anesthetic complications  Airway Mallampati: II       Dental   Pulmonary neg sleep apnea, neg COPD, former smoker,           Cardiovascular hypertension, Pt. on medications and Pt. on home beta blockers + Cardiac Stents  (-) Past MI and (-) CHF (-) dysrhythmias (-) Valvular Problems/Murmurs     Neuro/Psych neg Seizures    GI/Hepatic Neg liver ROS, Bowel prep,neg GERD  ,  Endo/Other  diabetes, Type 2, Oral Hypoglycemic AgentsMorbid obesity  Renal/GU Renal InsufficiencyRenal disease     Musculoskeletal   Abdominal   Peds  Hematology   Anesthesia Other Findings   Reproductive/Obstetrics                             Anesthesia Physical Anesthesia Plan  ASA: III  Anesthesia Plan: General   Post-op Pain Management:    Induction: Intravenous  PONV Risk Score and Plan:   Airway Management Planned:   Additional Equipment:   Intra-op Plan:   Post-operative Plan:   Informed Consent:   Plan Discussed with:   Anesthesia Plan Comments:         Anesthesia Quick Evaluation

## 2017-07-18 ENCOUNTER — Encounter: Payer: Self-pay | Admitting: Internal Medicine

## 2017-07-26 IMAGING — CT CT CHEST W/O CM
1 of 2 series · 12 of 29 positions shown, 15 images · non-contrast
Comparison: Chest x-ray of 10/30/2008 and CT abdomen pelvis of
11/13/2010

CLINICAL DATA: K interest polyp resected recently, history of
chronic renal disease

EXAM:
CT CHEST, ABDOMEN AND PELVIS WITHOUT CONTRAST
TECHNIQUE: Multidetector CT imaging of the chest, abdomen and pelvis was
performed following the standard protocol without IV contrast.

[Series 2: axial st · axial · 0.98mm/px · z∈[-1046,-481]mm · 12 of 137 slices shown, 15 images]
[im 12/137  mediastinal]
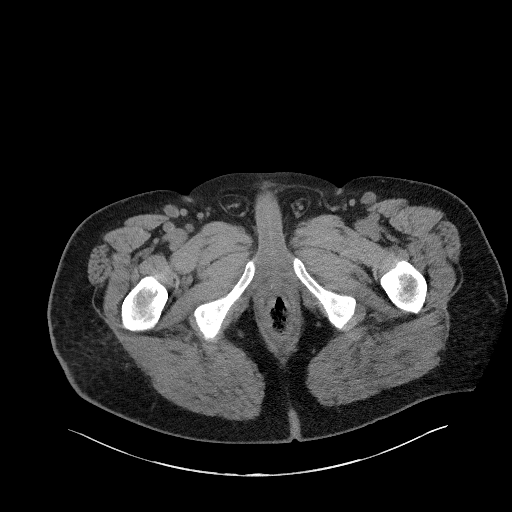
[im 12/137  lung]
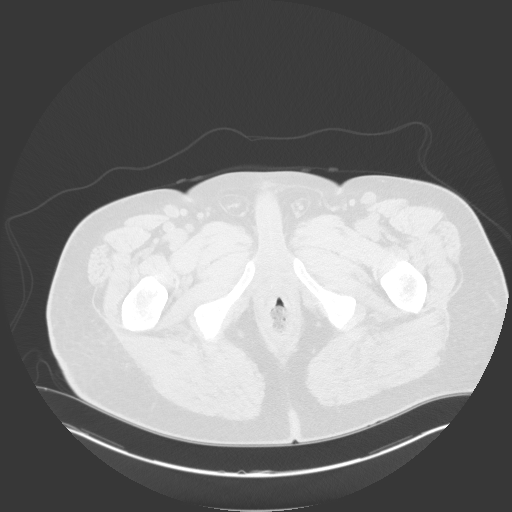
[im 23/137  lung]
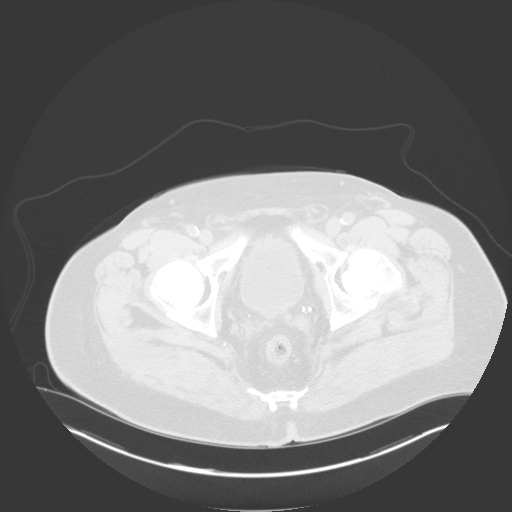
[im 35/137  lung]
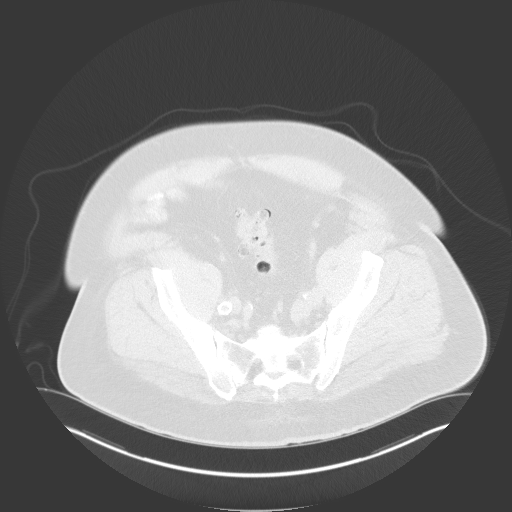
[im 46/137  lung]
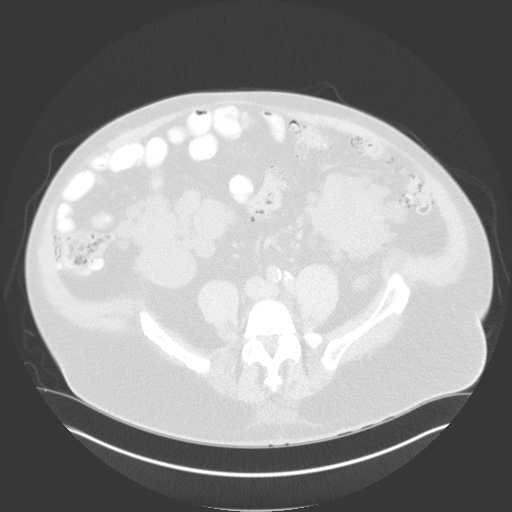
[im 57/137  mediastinal]
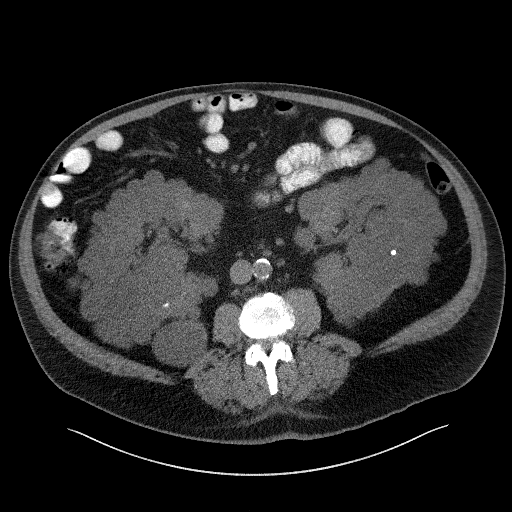
[im 57/137  lung]
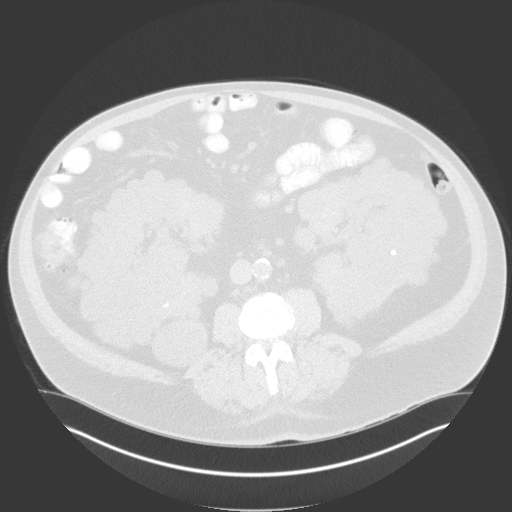
[im 67/137  lung]
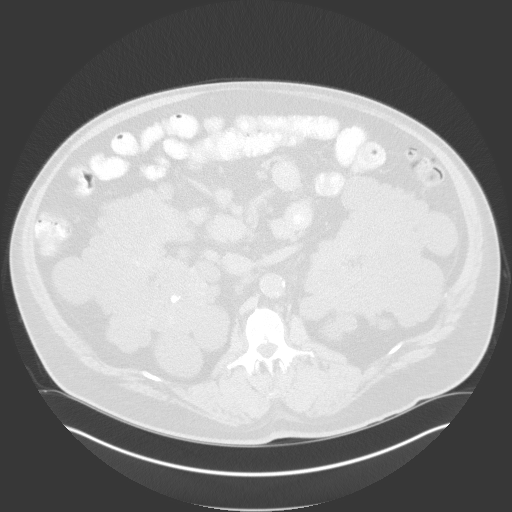
[im 69/137  lung]
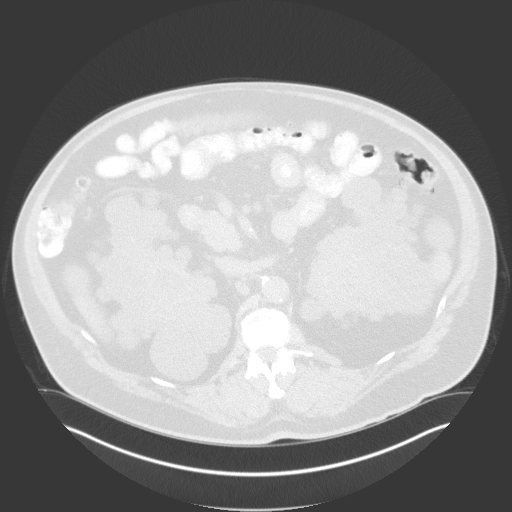
[im 80/137  lung]
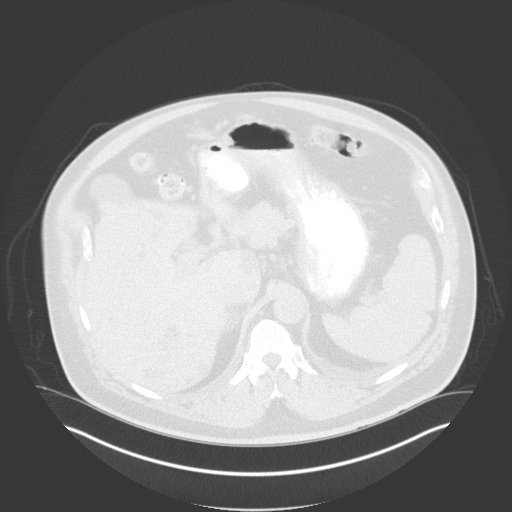
[im 91/137  mediastinal]
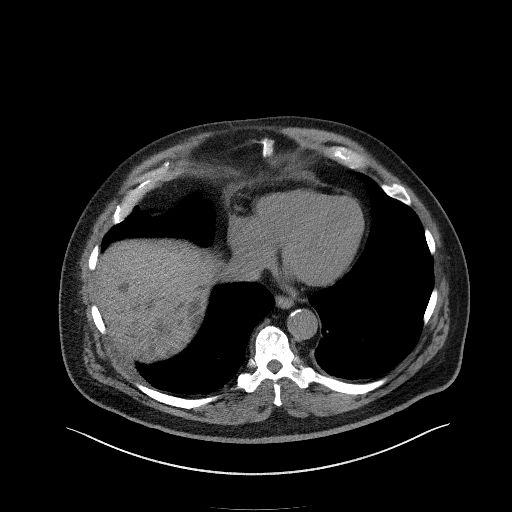
[im 91/137  lung]
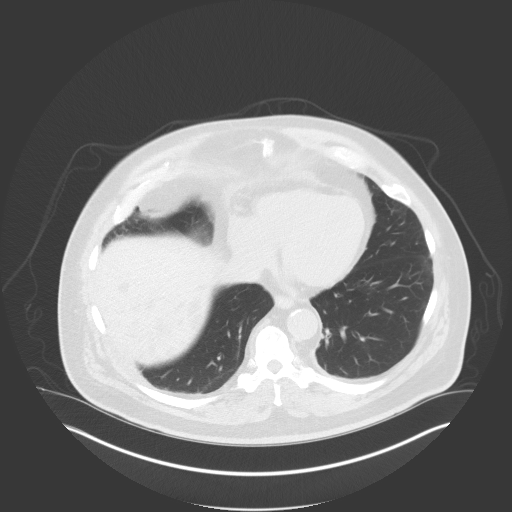
[im 103/137  lung]
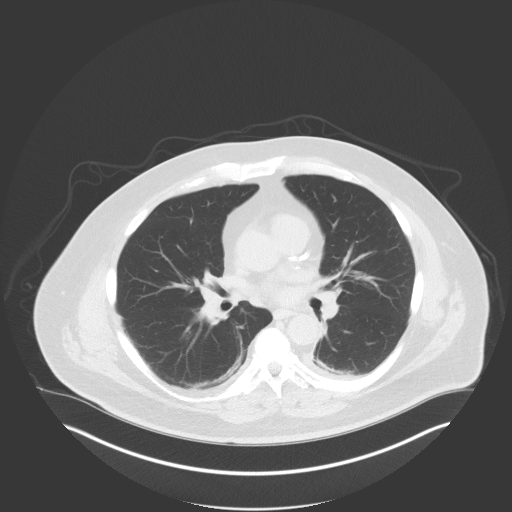
[im 114/137  lung]
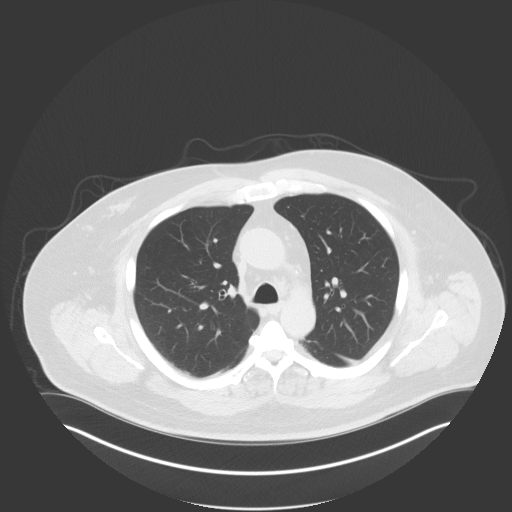
[im 125/137  lung]
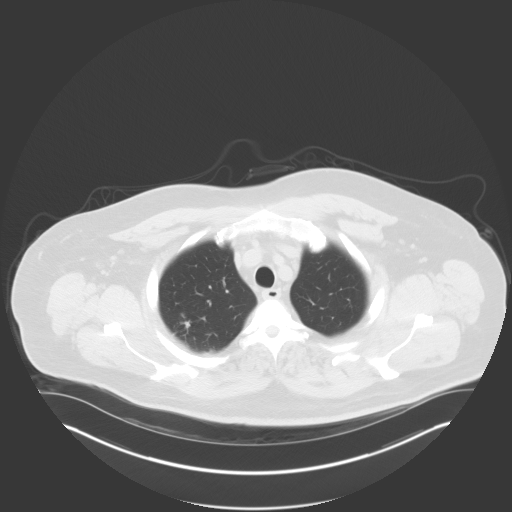

[12 of 29 positions shown; findings below may reference images not displayed]

FINDINGS: CT CHEST

On lung window images there is a nodular opacity in the right upper
lobe posteriorly on axial image 30 series 4 which on sagittal image
96 appears somewhat elongated and may represent scarring. Metastatic
involvement of the lungs is less likely. No additional pulmonary
nodule is seen. No parenchymal infiltrate is noted and there is no
evidence of pleural effusion. The central airway is patent.
Bibasilar subpleural linear fibrotic change is noted.

On soft tissue window images, the thyroid gland is unremarkable. On
this unenhanced study, only small mediastinal and hilar lymph nodes
are present. No adenopathy is seen. A item apparent stent is noted
in the region of the left anterior descending coronary artery.

CT ABDOMEN AND PELVIS

On this unenhanced study come there are multiple low-attenuation
rounded lesions scattered throughout the entire liver. In this
patient with a history of polycystic kidney disease, these most
likely represent cysts, making exclusion of metastatic lesions
difficult particular in the unenhanced state. No calcified
gallstones are seen. The pancreas is normal in size and the
pancreatic duct is not dilated. A low-attenuation structure appears
to emanate from the right adrenal gland measuring 3.9 cm with
attenuation buckle oval and water, most consistent with adrenal
cyst. The left adrenal gland is not well seen but no mass is
evident. The spleen is normal in size. The stomach is not well
distended but no abnormality is noted. Multiple renal cysts replace
much of the renal parenchyma bilaterally consistent with polycystic
kidney disease. The kidneys also are significantly enlarged
bilaterally. A few renal calculi are noted bilaterally but no
hydronephrosis is noted. The abdominal aorta is normal in caliber
with moderate atherosclerotic change for age. No adenopathy is seen.

The urinary bladder is not well distended but no abnormality is
evident. The distal ureters appear to be normal in caliber. The
prostate is normal in size. Multiple rectosigmoid colon diverticula
are present. No diverticulitis is seen. Diverticula also are
scattered throughout the more proximal colon. The terminal ileum and
the appendix are unremarkable. The thoracolumbar vertebrae are
normal alignment. There is degenerative change in the lower lumbar
spine with degenerative disc disease particularly at L5-S1. No lytic
or blastic bony lesion is seen.
IMPRESSION: 1. Nodular linear opacity in the right upper lobe probably
represents prior inflammatory or infectious etiology with scarring.
Metastatic involvement is less likely. Consider followup.
2. Enlarged kidneys with multiple cysts of varying sizes replacing
the renal parenchyma consistent with polycystic kidney disease.
Nonobstructing bilateral renal calculi are noted.
3. Multiple low-attenuation lesions throughout the liver are
consistent with cysts in this patient with history of polycystic
kidney disease, making exclusion of subtle metastatic lesions
difficult.
4. Probable right adrenal cyst of 3.9 cm.
5. Multiple colon diverticula.  No diverticulitis.

## 2018-05-29 DIAGNOSIS — E1165 Type 2 diabetes mellitus with hyperglycemia: Secondary | ICD-10-CM | POA: Insufficient documentation

## 2018-08-12 ENCOUNTER — Other Ambulatory Visit (INDEPENDENT_AMBULATORY_CARE_PROVIDER_SITE_OTHER): Payer: Self-pay | Admitting: Vascular Surgery

## 2018-08-12 DIAGNOSIS — N186 End stage renal disease: Secondary | ICD-10-CM

## 2018-08-13 ENCOUNTER — Ambulatory Visit (INDEPENDENT_AMBULATORY_CARE_PROVIDER_SITE_OTHER): Payer: Medicare Other | Admitting: Nurse Practitioner

## 2018-08-13 ENCOUNTER — Ambulatory Visit (INDEPENDENT_AMBULATORY_CARE_PROVIDER_SITE_OTHER): Payer: Medicare Other

## 2018-08-13 ENCOUNTER — Encounter (INDEPENDENT_AMBULATORY_CARE_PROVIDER_SITE_OTHER): Payer: Self-pay | Admitting: Vascular Surgery

## 2018-08-13 VITALS — BP 128/75 | HR 62 | Resp 16 | Ht 71.0 in | Wt 263.0 lb

## 2018-08-13 DIAGNOSIS — E669 Obesity, unspecified: Secondary | ICD-10-CM

## 2018-08-13 DIAGNOSIS — E1122 Type 2 diabetes mellitus with diabetic chronic kidney disease: Secondary | ICD-10-CM

## 2018-08-13 DIAGNOSIS — I129 Hypertensive chronic kidney disease with stage 1 through stage 4 chronic kidney disease, or unspecified chronic kidney disease: Secondary | ICD-10-CM

## 2018-08-13 DIAGNOSIS — E1169 Type 2 diabetes mellitus with other specified complication: Secondary | ICD-10-CM | POA: Diagnosis not present

## 2018-08-13 DIAGNOSIS — N186 End stage renal disease: Secondary | ICD-10-CM | POA: Diagnosis not present

## 2018-08-13 DIAGNOSIS — N184 Chronic kidney disease, stage 4 (severe): Secondary | ICD-10-CM | POA: Diagnosis not present

## 2018-08-13 DIAGNOSIS — I10 Essential (primary) hypertension: Secondary | ICD-10-CM

## 2018-08-20 ENCOUNTER — Encounter (INDEPENDENT_AMBULATORY_CARE_PROVIDER_SITE_OTHER): Payer: Self-pay | Admitting: Nurse Practitioner

## 2018-08-20 NOTE — Progress Notes (Addendum)
Subjective:    Patient ID: Tyrone Campbell, male    DOB: 1951/02/03, 67 y.o.   MRN: [id number] Chief Complaint  Patient presents with  . New Patient (Initial Visit)    ref Chauncey Reading for vein mapping    HPI  Tyrone Campbell is a 67 y.o. male that is referred by Dr. Percell Miller. The patient is seen for evaluation for dialysis access. The patient has chronic renal insufficiency stage V secondary to hypertension and diabetes. The patient's most recent creatinine clearance is less than 10. The patient volume status has not yet become an issue. Patient's blood pressures been relatively well controlled. There are mild uremic symptoms which appear to be relatively well tolerated at this time. The patient is left-handed.  The patient has been considering the various methods of dialysis and wishes to proceed with hemodialysis and therefore creation of AV access.  The patient has a history of traumatic left arm detachment and he was 12.  His arm was surgically reattached.  Patient plans to do home hemodialysis.  The patient denies amaurosis fugax or recent TIA symptoms. There are no recent neurological changes noted. The patient denies claudication symptoms or rest pain symptoms. The patient denies history of DVT, PE or superficial thrombophlebitis. The patient denies recent episodes of angina or shortness of breath.   Past Medical History:  Diagnosis Date  . Chronic kidney disease    stage 4  . Colon cancer ([place]) 02-06-16   INVASIVE COLORECTAL ADENOCARCINOMA, MODERATELY DIFFERENTIATED  . Colon polyp 02-06-16   TUBULAR ADENOMA and INVASIVE COLORECTAL ADENOCARCINOMA, MODERATELY DIFFERENTIATED  . Coronary artery disease   . Diabetes mellitus without complication ([place])   . Hyperlipemia   . Hypertension   . Polycystic kidney disease     Past Surgical History:  Procedure Laterality Date  . COLONOSCOPY WITH PROPOFOL N/A 02/06/2016   Procedure: COLONOSCOPY WITH PROPOFOL;  Surgeon: Lollie Sails,  MD;  Location: [facility] ENDOSCOPY;  Service: Endoscopy;  Laterality: N/A;  . COLONOSCOPY WITH PROPOFOL N/A 07/17/2017   Procedure: COLONOSCOPY WITH PROPOFOL;  Surgeon: Toledo, Benay Pike, MD;  Location: [facility] ENDOSCOPY;  Service: Gastroenterology;  Laterality: N/A;  . CORONARY ANGIOPLASTY    . CORONARY ARTERY BYPASS GRAFT  Jan 2010   [facility]  . FRACTURE SURGERY    . HERNIA REPAIR  1999   Dr Bary Castilla  . MENISCUS REPAIR Right 2014  . RECTAL EUS  03/01/2016  . TONSILLECTOMY      Social History   Socioeconomic History  . Marital status: Married    Spouse name: Not on file  . Number of children: Not on file  . Years of education: Not on file  . Highest education level: Not on file  Occupational History  . Not on file  Social Needs  . Financial resource strain: Not on file  . Food insecurity:    Worry: Not on file    Inability: Not on file  . Transportation needs:    Medical: Not on file    Non-medical: Not on file  Tobacco Use  . Smoking status: Former Smoker    Packs/day: 0.25    Years: 2.00    Pack years: 0.50    Types: Cigarettes    Last attempt to quit: 02/28/1975    Years since quitting: 43.5  . Smokeless tobacco: Never Used  Substance and Sexual Activity  . Alcohol use: Yes    Alcohol/week: 0.0 standard drinks    Comment: OCCASIONAL  . Drug use: No  .  Sexual activity: Not on file  Lifestyle  . Physical activity:    Days per week: Not on file    Minutes per session: Not on file  . Stress: Not on file  Relationships  . Social connections:    Talks on phone: Not on file    Gets together: Not on file    Attends religious service: Not on file    Active member of club or organization: Not on file    Attends meetings of clubs or organizations: Not on file    Relationship status: Not on file  . Intimate partner violence:    Fear of current or ex partner: Not on file    Emotionally abused: Not on file    Physically abused: Not on file    Forced sexual activity: Not on  file  Other Topics Concern  . Not on file  Social History Narrative  . Not on file    Family History  Problem Relation Age of Onset  . Stroke Father   . Polycystic kidney disease Mother     No Known Allergies   Review of Systems   Review of Systems: Negative Unless Checked Constitutional: [] Weight loss  [] Fever  [] Chills Cardiac: [] Chest pain   []  Atrial Fibrillation  [] Palpitations   [] Shortness of breath when laying flat   [] Shortness of breath with exertion. Vascular:  [] Pain in legs with walking   [] Pain in legs with standing  [] History of DVT   [] Phlebitis   [] Swelling in legs   [] Varicose veins   [] Non-healing ulcers Pulmonary:   [] Uses home oxygen   [] Productive cough   [] Hemoptysis   [] Wheeze  [] COPD   [] Asthma Neurologic:  [] Dizziness   [] Seizures   [] History of stroke   [] History of TIA  [] Aphasia   [] Vissual changes   [] Weakness or numbness in arm   [] Weakness or numbness in leg Musculoskeletal:   [] Joint swelling   [] Joint pain   [] Low back pain  []  History of Knee Replacement Hematologic:  [] Easy bruising  [] Easy bleeding   [] Hypercoagulable state   [] Anemic Gastrointestinal:  [] Diarrhea   [] Vomiting  [] Gastroesophageal reflux/heartburn   [] Difficulty swallowing. Genitourinary:  [x] Chronic kidney disease   [] Difficult urination  [] Anuric   [] Blood in urine Skin:  [] Rashes   [] Ulcers  Psychological:  [] History of anxiety   []  History of major depression  []  Memory Difficulties     Objective:   Physical Exam  BP 128/75 (BP Location: Right Arm)   Pulse 62   Resp 16   Ht 5\' 11"  (1.803 m)   Wt 263 lb (119.3 kg)   BMI 36.68 kg/m   Gen: WD/WN, NAD Head: [place]/AT, No temporalis wasting.  Ear/Nose/Throat: Hearing grossly intact, nares w/o erythema or drainage Eyes: PER, EOMI, sclera nonicteric.  Neck: Supple, no masses.  No JVD.  Pulmonary:  Good air movement, no use of accessory muscles.  Cardiac: RRR Vascular:  Vessel Right Left  Radial Palpable Palpable    Gastrointestinal: soft, non-distended. No guarding/no peritoneal signs.  Large hernia present Musculoskeletal: M/S 5/5 throughout.  No deformity or atrophy.  Neurologic: Pain and light touch intact in extremities.  Symmetrical.  Speech is fluent. Motor exam as listed above. Psychiatric: Judgment intact, Mood & affect appropriate for pt's clinical situation. Dermatologic: No Venous rashes. No Ulcers Noted.  No changes consistent with cellulitis. Lymph : No Cervical lymphadenopathy, no lichenification or skin changes of chronic lymphedema.      Assessment & Plan:  1. Chronic kidney disease, stage IV (severe) ([place]) Recommend:  At this time the patient does not have appropriate extremity access for dialysis  Patient should have a right radiocephalic AV fistula created.  The risks, benefits and alternative therapies were reviewed in detail with the patient.  All questions were answered.  The patient agrees to proceed with surgery.    2. Diabetes mellitus type 2 in obese [facility]) Continue hypoglycemic medications as already ordered, these medications have been reviewed and there are no changes at this time.  Hgb A1C to be monitored as already arranged by primary service   3. Essential hypertension Continue antihypertensive medications as already ordered, these medications have been reviewed and there are no changes at this time.    Current Outpatient Medications on File Prior to Visit  Medication Sig Dispense Refill  . albuterol (PROVENTIL HFA;VENTOLIN HFA) 108 (90 Base) MCG/ACT inhaler Inhale into the lungs every 6 (six) hours as needed for wheezing or shortness of breath.    Marland Kitchen aspirin 81 MG tablet Take 81 mg by mouth daily.    . carvedilol (COREG) 25 MG tablet Take 25 mg by mouth 2 (two) times daily with a meal.    . cloNIDine (CATAPRES) 0.2 MG tablet Take 0.2 mg by mouth 2 (two) times daily.    . clopidogrel (PLAVIX) 75 MG tablet Take 75 mg by mouth daily.    . furosemide  (LASIX) 20 MG tablet Take 20 mg by mouth.    . Insulin Glargine (BASAGLAR KWIKPEN) 100 UNIT/ML SOPN Inject 32 Units into the skin daily.    . insulin lispro (HUMALOG KWIKPEN) 100 UNIT/ML KwikPen Inject 10 units before the supper meal daily.    . isosorbide mononitrate (IMDUR) 120 MG 24 hr tablet Take 120 mg by mouth daily.    . ramipril (ALTACE) 10 MG capsule Take 10 mg by mouth 2 (two) times daily.    . sevelamer (RENAGEL) 800 MG tablet   11  . simvastatin (ZOCOR) 20 MG tablet Take 20 mg by mouth daily.    . sodium bicarbonate 650 MG tablet Take by mouth.    . tamsulosin (FLOMAX) 0.4 MG CAPS capsule Take 0.4 mg by mouth 2 (two) times daily.    . Alogliptin Benzoate 12.5 MG TABS Take 6.25 mg by mouth daily.    Marland Kitchen glipiZIDE (GLUCOTROL) 10 MG tablet Take 20 mg by mouth 2 (two) times daily before a meal.    . insulin glargine (LANTUS) 100 UNIT/ML injection Inject 26 Units into the skin at bedtime.    Marland Kitchen linagliptin (TRADJENTA) 5 MG TABS tablet Take 5 mg by mouth daily.    Marland Kitchen triamcinolone cream (KENALOG) 0.1 % Apply 1 application topically as needed.     No current facility-administered medications on file prior to visit.     There are no Patient Instructions on file for this visit. No follow-ups on file.   Kris Hartmann, NP  This note was completed with Sales executive.  Any errors are purely unintentional.

## 2018-09-03 ENCOUNTER — Encounter (INDEPENDENT_AMBULATORY_CARE_PROVIDER_SITE_OTHER): Payer: Self-pay

## 2018-09-04 ENCOUNTER — Other Ambulatory Visit (INDEPENDENT_AMBULATORY_CARE_PROVIDER_SITE_OTHER): Payer: Self-pay | Admitting: Nurse Practitioner

## 2018-09-05 ENCOUNTER — Other Ambulatory Visit: Payer: Self-pay

## 2018-09-05 ENCOUNTER — Encounter
Admission: RE | Admit: 2018-09-05 | Discharge: 2018-09-05 | Disposition: A | Discharge status: Home / Self Care | Payer: Medicare Other | Source: Ambulatory Visit | Attending: Vascular Surgery | Admitting: Vascular Surgery

## 2018-09-05 DIAGNOSIS — Z01818 Encounter for other preprocedural examination: Secondary | ICD-10-CM | POA: Insufficient documentation

## 2018-09-05 DIAGNOSIS — N186 End stage renal disease: Secondary | ICD-10-CM | POA: Diagnosis not present

## 2018-09-05 HISTORY — DX: Ventral hernia without obstruction or gangrene: K43.9

## 2018-09-05 LAB — CBC WITH DIFFERENTIAL/PLATELET
Abs Immature Granulocytes: 0.02 10*3/uL (ref 0.00–0.07)
Basophils Absolute: 0 10*3/uL (ref 0.0–0.1)
Basophils Relative: 1 %
EOS PCT: 10 %
Eosinophils Absolute: 0.6 10*3/uL — ABNORMAL HIGH (ref 0.0–0.5)
HCT: 31.9 % — ABNORMAL LOW (ref 39.0–52.0)
Hemoglobin: 10.6 g/dL — ABNORMAL LOW (ref 13.0–17.0)
Immature Granulocytes: 0 %
Lymphocytes Relative: 17 %
Lymphs Abs: 1 10*3/uL (ref 0.7–4.0)
MCH: 29 pg (ref 26.0–34.0)
MCHC: 33.2 g/dL (ref 30.0–36.0)
MCV: 87.4 fL (ref 80.0–100.0)
Monocytes Absolute: 0.5 10*3/uL (ref 0.1–1.0)
Monocytes Relative: 8 %
NRBC: 0 % (ref 0.0–0.2)
Neutro Abs: 3.7 10*3/uL (ref 1.7–7.7)
Neutrophils Relative %: 64 %
Platelets: 169 10*3/uL (ref 150–400)
RBC: 3.65 MIL/uL — ABNORMAL LOW (ref 4.22–5.81)
RDW: 12.9 % (ref 11.5–15.5)
WBC: 5.8 10*3/uL (ref 4.0–10.5)

## 2018-09-05 LAB — BASIC METABOLIC PANEL
Anion gap: 10 (ref 5–15)
BUN: 97 mg/dL — ABNORMAL HIGH (ref 8–23)
CO2: 23 mmol/L (ref 22–32)
CREATININE: 8.61 mg/dL — AB (ref 0.61–1.24)
Calcium: 8.5 mg/dL — ABNORMAL LOW (ref 8.9–10.3)
Chloride: 106 mmol/L (ref 98–111)
GFR calc Af Amer: 7 mL/min — ABNORMAL LOW (ref >60)
GFR, EST NON AFRICAN AMERICAN: 6 mL/min — AB (ref >60)
Glucose, Bld: 116 mg/dL — ABNORMAL HIGH (ref 70–99)
Potassium: 4.4 mmol/L (ref 3.5–5.1)
Sodium: 139 mmol/L (ref 135–145)

## 2018-09-05 LAB — TYPE AND SCREEN
ABO/RH(D): B POS
Antibody Screen: NEGATIVE

## 2018-09-05 LAB — PROTIME-INR
INR: 1.15
Prothrombin Time: 14.6 seconds (ref 11.4–15.2)

## 2018-09-05 LAB — APTT: APTT: 30 s (ref 24–36)

## 2018-09-05 NOTE — Patient Instructions (Signed)
Your procedure is scheduled on: Friday 12/13 Report to Day Surgery. To find out your arrival time please call [phone] between 1PM - 3PM on Thurs. 12/12.  Remember: Instructions that are not followed completely may result in serious medical risk,  up to and including death, or upon the discretion of your surgeon and anesthesiologist your  surgery may need to be rescheduled.     _X__ 1. Do not eat food after midnight the night before your procedure.                 No gum chewing or hard candies. You may drink clear liquids up to 2 hours                 before you are scheduled to arrive for your surgery- DO not drink clear                 liquids within 2 hours of the start of your surgery.                 Clear Liquids include:  water,  __X__2.  On the morning of surgery brush your teeth with toothpaste and water, you                may rinse your mouth with mouthwash if you wish.  Do not swallow any toothpaste of mouthwash.     ___ 3.  No Alcohol for 24 hours before or after surgery.   ___ 4.  Do Not Smoke or use e-cigarettes For 24 Hours Prior to Your Surgery.                 Do not use any chewable tobacco products for at least 6 hours prior to                 surgery.  ____  5.  Bring all medications with you on the day of surgery if instructed.   __x__  6.  Notify your doctor if there is any change in your medical condition      (cold, fever, infections).     Do not wear jewelry, make-up, hairpins, clips or nail polish. Do not wear lotions, powders, or perfumes. You may wear deodorant. Do not shave 48 hours prior to surgery. Men may shave face and neck. Do not bring valuables to the hospital.    [facility] is not responsible for any belongings or valuables.  Contacts, dentures or bridgework may not be worn into surgery. Leave your suitcase in the car. After surgery it may be brought to your room. For patients admitted to the hospital, discharge  time is determined by your treatment team.   Patients discharged the day of surgery will not be allowed to drive home.   Please read over the following fact sheets that you were given:   __x__ Take these medicines the morning of surgery with A SIP OF WATER:    1. carvedilol (COREG) 25 MG tablet  2. cloNIDine (CATAPRES) 0.2 MG tablet  3. isosorbide mononitrate (IMDUR) 120 MG 24 hr tablet  4.tamsulosin (FLOMAX) 0.4 MG CAPS capsule  5.  6.  ____ Fleet Enema (as directed)   __x__ Use CHG Soap as directed  ____ Use inhalers on the day of surgery  ____ Stop metformin 2 days prior to surgery    ____ Take 1/2 of usual insulin dose the night before surgery. No insulin the morning  of surgery.   __x__ Stop Plavix last dose on 12/6  Continue aspirin   ____ Stop Anti-inflammatories on    ____ Stop supplements until after surgery.    ____ Bring C-Pap to the hospital.

## 2018-09-06 DIAGNOSIS — Z01818 Encounter for other preprocedural examination: Secondary | ICD-10-CM | POA: Diagnosis not present

## 2018-09-08 ENCOUNTER — Other Ambulatory Visit (INDEPENDENT_AMBULATORY_CARE_PROVIDER_SITE_OTHER): Payer: Self-pay | Admitting: Nurse Practitioner

## 2018-09-08 LAB — SURGICAL PCR SCREEN
MRSA, PCR: POSITIVE — AB
Staphylococcus aureus: POSITIVE — AB

## 2018-09-08 MED ORDER — VANCOMYCIN HCL 10 G IV SOLR: 1000.0000 mg | Freq: Once | INTRAVENOUS | Status: AC

## 2018-09-09 ENCOUNTER — Other Ambulatory Visit (INDEPENDENT_AMBULATORY_CARE_PROVIDER_SITE_OTHER): Payer: Self-pay | Admitting: Nurse Practitioner

## 2018-09-12 ENCOUNTER — Ambulatory Visit: Payer: Medicare Other | Admitting: Anesthesiology

## 2018-09-12 ENCOUNTER — Encounter: Payer: Self-pay | Admitting: *Deleted

## 2018-09-12 ENCOUNTER — Encounter
Admission: RE | Disposition: A | Discharge status: Home / Self Care | Payer: Self-pay | Source: Home / Self Care | Attending: Vascular Surgery

## 2018-09-12 ENCOUNTER — Ambulatory Visit
Admission: RE | Admit: 2018-09-12 | Discharge: 2018-09-12 | Disposition: A | Discharge status: Home / Self Care | Payer: Medicare Other | Attending: Vascular Surgery | Admitting: Vascular Surgery

## 2018-09-12 ENCOUNTER — Other Ambulatory Visit: Payer: Self-pay

## 2018-09-12 DIAGNOSIS — Z7902 Long term (current) use of antithrombotics/antiplatelets: Secondary | ICD-10-CM | POA: Insufficient documentation

## 2018-09-12 DIAGNOSIS — Z794 Long term (current) use of insulin: Secondary | ICD-10-CM | POA: Insufficient documentation

## 2018-09-12 DIAGNOSIS — Z7982 Long term (current) use of aspirin: Secondary | ICD-10-CM | POA: Diagnosis not present

## 2018-09-12 DIAGNOSIS — Z79899 Other long term (current) drug therapy: Secondary | ICD-10-CM | POA: Diagnosis not present

## 2018-09-12 DIAGNOSIS — I120 Hypertensive chronic kidney disease with stage 5 chronic kidney disease or end stage renal disease: Secondary | ICD-10-CM | POA: Insufficient documentation

## 2018-09-12 DIAGNOSIS — E1122 Type 2 diabetes mellitus with diabetic chronic kidney disease: Secondary | ICD-10-CM | POA: Diagnosis not present

## 2018-09-12 DIAGNOSIS — I2510 Atherosclerotic heart disease of native coronary artery without angina pectoris: Secondary | ICD-10-CM | POA: Insufficient documentation

## 2018-09-12 DIAGNOSIS — Z87891 Personal history of nicotine dependence: Secondary | ICD-10-CM | POA: Insufficient documentation

## 2018-09-12 DIAGNOSIS — N186 End stage renal disease: Secondary | ICD-10-CM | POA: Diagnosis not present

## 2018-09-12 DIAGNOSIS — Z955 Presence of coronary angioplasty implant and graft: Secondary | ICD-10-CM | POA: Insufficient documentation

## 2018-09-12 DIAGNOSIS — E785 Hyperlipidemia, unspecified: Secondary | ICD-10-CM | POA: Diagnosis not present

## 2018-09-12 HISTORY — PX: AV FISTULA PLACEMENT: SHX1204

## 2018-09-12 LAB — ABO/RH: ABO/RH(D): B POS

## 2018-09-12 LAB — GLUCOSE, CAPILLARY
Glucose-Capillary: 154 mg/dL — ABNORMAL HIGH (ref 70–99)
Glucose-Capillary: 170 mg/dL — ABNORMAL HIGH (ref 70–99)

## 2018-09-12 SURGERY — ARTERIOVENOUS (AV) FISTULA CREATION
Anesthesia: General | Laterality: Right

## 2018-09-12 MED ORDER — LIDOCAINE HCL (CARDIAC) PF 100 MG/5ML IV SOSY
PREFILLED_SYRINGE | INTRAVENOUS | Status: DC | PRN
  Administered 2018-09-12: 90 mg via INTRAVENOUS

## 2018-09-12 MED ORDER — MIDAZOLAM HCL 2 MG/2ML IJ SOLN
INTRAMUSCULAR | Status: DC | PRN
  Administered 2018-09-12: 2 mg via INTRAVENOUS

## 2018-09-12 MED ORDER — ONDANSETRON HCL 4 MG/2ML IJ SOLN: 4.0000 mg | Freq: Four times a day (QID) | INTRAMUSCULAR | Status: DC | PRN

## 2018-09-12 MED ORDER — BUPIVACAINE HCL (PF) 0.5 % IJ SOLN
INTRAMUSCULAR | Status: DC | PRN
  Administered 2018-09-12: 3 mL

## 2018-09-12 MED ORDER — ONDANSETRON HCL 4 MG/2ML IJ SOLN
INTRAMUSCULAR | Status: DC | PRN
  Administered 2018-09-12: 4 mg via INTRAVENOUS

## 2018-09-12 MED ORDER — BUPIVACAINE HCL (PF) 0.5 % IJ SOLN
INTRAMUSCULAR | Status: AC
  Filled 2018-09-12: qty 30

## 2018-09-12 MED ORDER — SODIUM CHLORIDE 0.9 % IV SOLN
INTRAVENOUS | Status: DC
  Administered 2018-09-12: 07:00:00 via INTRAVENOUS

## 2018-09-12 MED ORDER — ONDANSETRON HCL 4 MG/2ML IJ SOLN: 4.0000 mg | Freq: Once | INTRAMUSCULAR | Status: DC | PRN

## 2018-09-12 MED ORDER — FENTANYL CITRATE (PF) 100 MCG/2ML IJ SOLN: 25.0000 ug | INTRAMUSCULAR | Status: DC | PRN

## 2018-09-12 MED ORDER — LIDOCAINE HCL (PF) 2 % IJ SOLN
INTRAMUSCULAR | Status: AC
  Filled 2018-09-12: qty 10

## 2018-09-12 MED ORDER — DEXMEDETOMIDINE HCL IN NACL 80 MCG/20ML IV SOLN
INTRAVENOUS | Status: AC
  Filled 2018-09-12: qty 20

## 2018-09-12 MED ORDER — HYDROCODONE-ACETAMINOPHEN 5-325 MG PO TABS: 1.0000 | ORAL_TABLET | Freq: Four times a day (QID) | ORAL | 0 refills | Status: DC | PRN

## 2018-09-12 MED ORDER — FENTANYL CITRATE (PF) 100 MCG/2ML IJ SOLN
INTRAMUSCULAR | Status: AC
  Filled 2018-09-12: qty 2

## 2018-09-12 MED ORDER — HEPARIN SODIUM (PORCINE) 5000 UNIT/ML IJ SOLN
INTRAMUSCULAR | Status: AC
  Filled 2018-09-12: qty 1

## 2018-09-12 MED ORDER — ONDANSETRON HCL 4 MG/2ML IJ SOLN
INTRAMUSCULAR | Status: AC
  Filled 2018-09-12: qty 2

## 2018-09-12 MED ORDER — PHENYLEPHRINE HCL 10 MG/ML IJ SOLN
INTRAMUSCULAR | Status: DC | PRN
  Administered 2018-09-12 (×2): 50 ug via INTRAVENOUS
  Administered 2018-09-12 (×2): 100 ug via INTRAVENOUS

## 2018-09-12 MED ORDER — PROPOFOL 10 MG/ML IV BOLUS
INTRAVENOUS | Status: AC
  Filled 2018-09-12: qty 40

## 2018-09-12 MED ORDER — DEXAMETHASONE SODIUM PHOSPHATE 10 MG/ML IJ SOLN
INTRAMUSCULAR | Status: AC
  Filled 2018-09-12: qty 1

## 2018-09-12 MED ORDER — HYDROMORPHONE HCL 1 MG/ML IJ SOLN: 1.0000 mg | Freq: Once | INTRAMUSCULAR | Status: DC | PRN

## 2018-09-12 MED ORDER — EPHEDRINE SULFATE 50 MG/ML IJ SOLN
INTRAMUSCULAR | Status: AC
  Filled 2018-09-12: qty 1

## 2018-09-12 MED ORDER — VANCOMYCIN HCL IN DEXTROSE 1-5 GM/200ML-% IV SOLN
1000.0000 mg | Freq: Once | INTRAVENOUS | Status: AC
  Administered 2018-09-12: 1000 mg via INTRAVENOUS

## 2018-09-12 MED ORDER — DEXMEDETOMIDINE HCL 200 MCG/2ML IV SOLN
INTRAVENOUS | Status: DC | PRN
  Administered 2018-09-12 (×4): 4 ug via INTRAVENOUS

## 2018-09-12 MED ORDER — MIDAZOLAM HCL 2 MG/2ML IJ SOLN
INTRAMUSCULAR | Status: AC
  Filled 2018-09-12: qty 2

## 2018-09-12 MED ORDER — DEXAMETHASONE SODIUM PHOSPHATE 10 MG/ML IJ SOLN
INTRAMUSCULAR | Status: DC | PRN
  Administered 2018-09-12: 5 mg via INTRAVENOUS

## 2018-09-12 MED ORDER — PAPAVERINE HCL 30 MG/ML IJ SOLN
INTRAMUSCULAR | Status: AC
  Filled 2018-09-12: qty 2

## 2018-09-12 MED ORDER — PROPOFOL 10 MG/ML IV BOLUS
INTRAVENOUS | Status: DC | PRN
  Administered 2018-09-12: 170 mg via INTRAVENOUS

## 2018-09-12 MED ORDER — PHENYLEPHRINE HCL 10 MG/ML IJ SOLN
INTRAMUSCULAR | Status: AC
  Filled 2018-09-12: qty 1

## 2018-09-12 MED ORDER — SODIUM CHLORIDE 0.9 % IV SOLN
INTRAVENOUS | Status: DC | PRN
  Administered 2018-09-12: 50 mL via INTRAMUSCULAR

## 2018-09-12 MED ORDER — EPHEDRINE SULFATE 50 MG/ML IJ SOLN
INTRAMUSCULAR | Status: DC | PRN
  Administered 2018-09-12 (×4): 10 mg via INTRAVENOUS
  Administered 2018-09-12 (×2): 5 mg via INTRAVENOUS

## 2018-09-12 MED ORDER — FAMOTIDINE 20 MG PO TABS
ORAL_TABLET | ORAL | Status: AC
  Administered 2018-09-12: 20 mg via ORAL
  Filled 2018-09-12: qty 1

## 2018-09-12 MED ORDER — FENTANYL CITRATE (PF) 100 MCG/2ML IJ SOLN
INTRAMUSCULAR | Status: DC | PRN
  Administered 2018-09-12: 25 ug via INTRAVENOUS
  Administered 2018-09-12: 100 ug via INTRAVENOUS
  Administered 2018-09-12: 25 ug via INTRAVENOUS

## 2018-09-12 MED ORDER — FAMOTIDINE 20 MG PO TABS
20.0000 mg | ORAL_TABLET | Freq: Once | ORAL | Status: AC
  Administered 2018-09-12: 20 mg via ORAL

## 2018-09-12 MED ORDER — VANCOMYCIN HCL IN DEXTROSE 1-5 GM/200ML-% IV SOLN
INTRAVENOUS | Status: AC
  Administered 2018-09-12: 1000 mg via INTRAVENOUS
  Filled 2018-09-12: qty 200

## 2018-09-12 SURGICAL SUPPLY — 57 items
APPLIER CLIP 11 MED OPEN (CLIP)
APPLIER CLIP 9.375 SM OPEN (CLIP)
BAG DECANTER FOR FLEXI CONT (MISCELLANEOUS) ×2 IMPLANT
BLADE SURG SZ11 CARB STEEL (BLADE) ×2 IMPLANT
BOOT SUTURE AID YELLOW STND (SUTURE) ×2 IMPLANT
BRUSH SCRUB EZ  4% CHG (MISCELLANEOUS) ×1
BRUSH SCRUB EZ 4% CHG (MISCELLANEOUS) ×1 IMPLANT
CANISTER SUCT 1200ML W/VALVE (MISCELLANEOUS) ×2 IMPLANT
CHLORAPREP W/TINT 26ML (MISCELLANEOUS) ×2 IMPLANT
CLIP APPLIE 11 MED OPEN (CLIP) IMPLANT
CLIP APPLIE 9.375 SM OPEN (CLIP) IMPLANT
COVER WAND RF STERILE (DRAPES) ×2 IMPLANT
DERMABOND ADVANCED (GAUZE/BANDAGES/DRESSINGS) ×1
DERMABOND ADVANCED .7 DNX12 (GAUZE/BANDAGES/DRESSINGS) ×1 IMPLANT
DRESSING SURGICEL FIBRLLR 1X2 (HEMOSTASIS) ×1 IMPLANT
DRSG SURGICEL FIBRILLAR 1X2 (HEMOSTASIS) ×2
ELECT CAUTERY BLADE 6.4 (BLADE) ×2 IMPLANT
ELECT REM PT RETURN 9FT ADLT (ELECTROSURGICAL) ×2
ELECTRODE REM PT RTRN 9FT ADLT (ELECTROSURGICAL) ×1 IMPLANT
GEL ULTRASOUND 20GR AQUASONIC (MISCELLANEOUS) IMPLANT
GLOVE BIO SURGEON STRL SZ7 (GLOVE) ×4 IMPLANT
GLOVE INDICATOR 7.5 STRL GRN (GLOVE) ×4 IMPLANT
GLOVE SURG SYN 8.0 (GLOVE) ×2 IMPLANT
GOWN STRL REUS W/ TWL LRG LVL3 (GOWN DISPOSABLE) ×2 IMPLANT
GOWN STRL REUS W/ TWL XL LVL3 (GOWN DISPOSABLE) ×1 IMPLANT
GOWN STRL REUS W/TWL LRG LVL3 (GOWN DISPOSABLE) ×2
GOWN STRL REUS W/TWL XL LVL3 (GOWN DISPOSABLE) ×1
IV NS 500ML (IV SOLUTION) ×1
IV NS 500ML BAXH (IV SOLUTION) ×1 IMPLANT
KIT TURNOVER KIT A (KITS) ×2 IMPLANT
LABEL OR SOLS (LABEL) ×2 IMPLANT
LOOP RED MAXI  1X406MM (MISCELLANEOUS) ×1
LOOP VESSEL MAXI 1X406 RED (MISCELLANEOUS) ×1 IMPLANT
LOOP VESSEL MINI 0.8X406 BLUE (MISCELLANEOUS) ×2 IMPLANT
LOOPS BLUE MINI 0.8X406MM (MISCELLANEOUS) ×2
NEEDLE FILTER BLUNT 18X 1/2SAF (NEEDLE) ×1
NEEDLE FILTER BLUNT 18X1 1/2 (NEEDLE) ×1 IMPLANT
NEEDLE HYPO 30X.5 LL (NEEDLE) IMPLANT
NS IRRIG 500ML POUR BTL (IV SOLUTION) IMPLANT
PACK EXTREMITY ARMC (MISCELLANEOUS) ×2 IMPLANT
PAD PREP 24X41 OB/GYN DISP (PERSONAL CARE ITEMS) ×2 IMPLANT
PUNCH SURGICAL ROTATE 2.7MM (MISCELLANEOUS) IMPLANT
STOCKINETTE STRL 4IN 9604848 (GAUZE/BANDAGES/DRESSINGS) ×2 IMPLANT
SUT MNCRL+ 5-0 UNDYED PC-3 (SUTURE) ×1 IMPLANT
SUT MONOCRYL 5-0 (SUTURE) ×1
SUT PROLENE 6 0 BV (SUTURE) ×8 IMPLANT
SUT SILK 2 0 (SUTURE) ×1
SUT SILK 2-0 18XBRD TIE 12 (SUTURE) ×1 IMPLANT
SUT SILK 3 0 (SUTURE) ×1
SUT SILK 3-0 18XBRD TIE 12 (SUTURE) ×1 IMPLANT
SUT SILK 4 0 (SUTURE) ×1
SUT SILK 4-0 18XBRD TIE 12 (SUTURE) ×1 IMPLANT
SUT VIC AB 3-0 SH 27 (SUTURE) ×1
SUT VIC AB 3-0 SH 27X BRD (SUTURE) ×1 IMPLANT
SYR 20CC LL (SYRINGE) ×2 IMPLANT
SYR 3ML LL SCALE MARK (SYRINGE) ×2 IMPLANT
TOWEL OR 17X26 4PK STRL BLUE (TOWEL DISPOSABLE) IMPLANT

## 2018-09-12 NOTE — Anesthesia Post-op Follow-up Note (Signed)
Anesthesia QCDR form completed.        

## 2018-09-12 NOTE — Anesthesia Postprocedure Evaluation (Signed)
Anesthesia Post Note  Patient: Tyrone Campbell  Procedure(s) Performed: ARTERIOVENOUS (AV) FISTULA CREATION ( RADIOCEPHALIC ) (Right )  Patient location during evaluation: PACU Anesthesia Type: General Level of consciousness: awake and alert Pain management: pain level controlled Vital Signs Assessment: post-procedure vital signs reviewed and stable Respiratory status: spontaneous breathing and respiratory function stable Cardiovascular status: stable Anesthetic complications: no     Last Vitals:  Vitals:   09/12/18 1023 09/12/18 1039  BP: (!) 182/94 (!) 162/89  Pulse: (!) 56 (!) 55  Resp: 16 14  Temp: 36.6 C   SpO2: 96% 98%    Last Pain:  Vitals:   09/12/18 1039  TempSrc:   PainSc: 0-No pain                 KEPHART,WILLIAM K

## 2018-09-12 NOTE — Anesthesia Preprocedure Evaluation (Signed)
Anesthesia Evaluation  Patient identified by MRN, date of birth, ID band Patient awake    Reviewed: Allergy & Precautions, NPO status , Patient's Chart, lab work & pertinent test results  History of Anesthesia Complications Negative for: history of anesthetic complications  Airway Mallampati: I       Dental  (+) Chipped, Missing   Pulmonary neg sleep apnea, neg COPD, former smoker,           Cardiovascular hypertension, Pt. on medications + Cardiac Stents  (-) Past MI and (-) CHF (-) dysrhythmias (-) Valvular Problems/Murmurs     Neuro/Psych neg Seizures    GI/Hepatic Neg liver ROS, neg GERD  ,  Endo/Other  diabetes, Type 2, Oral Hypoglycemic Agents  Renal/GU Renal InsufficiencyRenal disease     Musculoskeletal   Abdominal   Peds  Hematology   Anesthesia Other Findings   Reproductive/Obstetrics                             Anesthesia Physical Anesthesia Plan  ASA: IV  Anesthesia Plan: General   Post-op Pain Management:    Induction: Intravenous  PONV Risk Score and Plan: 2 and Dexamethasone and Ondansetron  Airway Management Planned: LMA  Additional Equipment:   Intra-op Plan:   Post-operative Plan:   Informed Consent: I have reviewed the patients History and Physical, chart, labs and discussed the procedure including the risks, benefits and alternatives for the proposed anesthesia with the patient or authorized representative who has indicated his/her understanding and acceptance.     Plan Discussed with:   Anesthesia Plan Comments:         Anesthesia Quick Evaluation

## 2018-09-12 NOTE — Discharge Instructions (Addendum)
You may restart aspirin and plavix tomorrow, 09/13/2018   General Anesthesia, Adult, Care After These instructions provide you with information about caring for yourself after your procedure. Your health care provider may also give you more specific instructions. Your treatment has been planned according to current medical practices, but problems sometimes occur. Call your health care provider if you have any problems or questions after your procedure. What can I expect after the procedure? After the procedure, it is common to have:  Vomiting.  A sore throat.  Mental slowness.  It is common to feel:  Nauseous.  Cold or shivery.  Sleepy.  Tired.  Sore or achy, even in parts of your body where you did not have surgery.  Follow these instructions at home: For at least 24 hours after the procedure:  Do not: ? Participate in activities where you could fall or become injured. ? Drive. ? Use heavy machinery. ? Drink alcohol. ? Take sleeping pills or medicines that cause drowsiness. ? Make important decisions or sign legal documents. ? Take care of children on your own.  Rest. Eating and drinking  If you vomit, drink water, juice, or soup when you can drink without vomiting.  Drink enough fluid to keep your urine clear or pale yellow.  Make sure you have little or no nausea before eating solid foods.  Follow the diet recommended by your health care provider. General instructions  Have a responsible adult stay with you until you are awake and alert.  Return to your normal activities as told by your health care provider. Ask your health care provider what activities are safe for you.  Take over-the-counter and prescription medicines only as told by your health care provider.  If you smoke, do not smoke without supervision.  Keep all follow-up visits as told by your health care provider. This is important. Contact a health care provider if:  You continue to have  nausea or vomiting at home, and medicines are not helpful.  You cannot drink fluids or start eating again.  You cannot urinate after 8-12 hours.  You develop a skin rash.  You have fever.  You have increasing redness at the site of your procedure. Get help right away if:  You have difficulty breathing.  You have chest pain.  You have unexpected bleeding.  You feel that you are having a life-threatening or urgent problem. This information is not intended to replace advice given to you by your health care provider. Make sure you discuss any questions you have with your health care provider. Document Released: 12/24/2000 Document Revised: 02/20/2016 Document Reviewed: 09/01/2015 Elsevier Interactive Patient Education  2018 [place].    AV Fistula Placement, Care After Refer to this sheet in the next few weeks. These instructions provide you with information about caring for yourself after your procedure. Your health care provider may also give you more specific instructions. Your treatment has been planned according to current medical practices, but problems sometimes occur. Call your health care provider if you have any problems or questions after your procedure. What can I expect after the procedure? After your procedure, it is common to:  Feel sore.  Have numbness.  Feel cold.  Feel a vibration (thrill) over the fistula.  Follow these instructions at home:  Incision care  Do not take baths or showers, swim, or use a hot tub until your health care provider approves.  Keep the area around your cut from surgery (incision) clean and dry.  Follow  instructions from your health care provider about how to take care of your incision. Make sure you: ? Wash your hands with soap and water before you change your bandage (dressing). If soap and water are not available, use hand sanitizer. ? Change your dressing as told by your health care provider. ? Leave stitches (sutures)  and clips in place. They may need to stay in place for 2 weeks or longer. Fistula Care  Check your fistula site every day to make sure the thrill feels the same.  Check your fistula site every day for signs of infection. Watch for: ? Redness. ? Swelling. ? Discharge. ? Tenderness. ? Enlargement.  Keep your arm raised (elevated) while you rest.  Do not lift anything that is heavier than a gallon of milk with the arm that has the fistula.  Do not lie down on your fistula arm.  Do not let anyone draw blood or take a blood pressure reading on your fistula arm.  Do not wear tight jewelry or clothing over your fistula arm. General instructions  Rest at home for a day or two.  Gradually start doing your usual activities again. Ask your surgeon when you can return to work or school.  Take over-the-counter and prescription medicines only as told by your health care provider.  Keep all follow-up visits as told by your health care provider. This is important. Contact a health care provider if:  You have chills or a fever.  You have pain at your fistula site that is not going away.  You have numbness or coldness at your fistula site that is not going away.  You feel a decrease or a change in the thrill.  You have swelling in your arm or hand.  You have redness, swelling, discharge, tenderness, or enlargement at your fistula site. Get help right away if:  You are bleeding from your fistula site.  You have chest pain.  You have trouble breathing. This information is not intended to replace advice given to you by your health care provider. Make sure you discuss any questions you have with your health care provider. Document Released: 09/17/2005 Document Revised: 01/25/2016 Document Reviewed: 12/08/2014 Elsevier Interactive Patient Education  2018 [employer].

## 2018-09-12 NOTE — Anesthesia Procedure Notes (Signed)
Procedure Name: LMA Insertion Date/Time: 09/12/2018 7:58 AM Performed by: Lavone Orn, CRNA Pre-anesthesia Checklist: Patient identified, Emergency Drugs available, Suction available, Patient being monitored and Timeout performed Patient Re-evaluated:Patient Re-evaluated prior to induction Oxygen Delivery Method: Circle system utilized Preoxygenation: Pre-oxygenation with 100% oxygen Induction Type: IV induction Ventilation: Oral airway inserted - appropriate to patient size LMA: LMA inserted LMA Size: 5.0 Number of attempts: 1 Tube secured with: Tape Dental Injury: Teeth and Oropharynx as per pre-operative assessment

## 2018-09-12 NOTE — Transfer of Care (Signed)
Immediate Anesthesia Transfer of Care Note  Patient: Tyrone Campbell  Procedure(s) Performed: ARTERIOVENOUS (AV) FISTULA CREATION ( RADIOCEPHALIC ) (Right )  Patient Location: PACU  Anesthesia Type:General  Level of Consciousness: awake and patient cooperative  Airway & Oxygen Therapy: Patient Spontanous Breathing and Patient connected to face mask oxygen  Post-op Assessment: Report given to RN and Post -op Vital signs reviewed and stable  Post vital signs: stable  Last Vitals:  Vitals Value Taken Time  BP 154/89 09/12/2018  9:29 AM  Temp    Pulse 63 09/12/2018  9:34 AM  Resp 13 09/12/2018  9:34 AM  SpO2 100 % 09/12/2018  9:34 AM  Vitals shown include unvalidated device data.  Last Pain:  Vitals:   09/12/18 0617  TempSrc: Oral  PainSc: 0-No pain         Complications: No apparent anesthesia complications

## 2018-09-12 NOTE — Op Note (Signed)
     OPERATIVE NOTE   PROCEDURE: right radiocephalic arteriovenous fistula placement  PRE-OPERATIVE DIAGNOSIS: End Stage Renal Disease  POST-OPERATIVE DIAGNOSIS: End Stage Renal Disease  SURGEON: Hortencia Pilar  ASSISTANT(S): Ms. Hezzie Bump  ANESTHESIA: general  ESTIMATED BLOOD LOSS: <50 cc  FINDING(S): 3.5 mm cephalic vein  SPECIMEN(S):  none  INDICATIONS:   Tyrone Campbell is a 67 y.o. male who presents with end stage renal disease.  The patient is scheduled for right brachiocephalic arteriovenous fistula placement.  The patient is aware the risks include but are not limited to: bleeding, infection, steal syndrome, nerve damage, ischemic monomelic neuropathy, failure to mature, and need for additional procedures.  The patient is aware of the risks of the procedure and elects to proceed forward.  DESCRIPTION: After full informed written consent was obtained from the patient, the patient was brought back to the operating room and placed supine upon the operating table.  Prior to induction, the patient received IV antibiotics.   After obtaining adequate anesthesia, the patient was then prepped and draped in the standard fashion for a right arm access procedure.   A first assistant was required to provide a safe and appropriate environment for executing the surgery.  The assistant was integral in providing retraction, exposure, running suture providing suction and in the closing process.   A linear incision was then created midway between the radial impulse and the cephalic vein. The cephalic vein was then identified and dissected circumferentially. It was marked with a surgical marker.    Attention was then turned to the radial artery which was exposed through the same incision and looped proximally and distally. Side branches were controlled with 4-0 silk ties.  The distal segment of the vein was ligated with a  2-0 silk, and the vein was transected.  The proximal segment  was interrogated with serial dilators.  The vein accepted up to a 3.5 mm dilator without any difficulty. Heparinized saline was infused into the vein and clamped it with a small bulldog.  At this point, I reset my exposure of the brachial artery and controlled the artery with vessel loops proximally and distally.  An arteriotomy was then made with a #11 blade, and extended with a Potts scissor.  Heparinized saline was injected proximal and distal into the radial artery.  The vein was then approximated to the artery while the artery was in its native bed and subsequently the vein was beveled using Potts scissors. The vein was then sewn to the artery in an end-to-side configuration with a running stitch of 6-0 Prolene.  Prior to completing this anastomosis Flushing maneuvers were performed and the artery was allowed to forward and back bleed.  There was no evidence of clot from any vessels.  I completed the anastomosis in the usual fashion and then released all vessel loops and clamps.    There was good  thrill in the venous outflow, and there was 1+ palpable radial pulse.  At this point, I irrigated out the surgical wound.  There was no further active bleeding.  The subcutaneous tissue was reapproximated with a running stitch of 3-0 Vicryl.  The skin was then reapproximated with a running subcuticular stitch of 4-0 Vicryl.  The skin was then cleaned, dried, and reinforced with Dermabond.    The patient tolerated this procedure well.   COMPLICATIONS: None  CONDITION: Tyrone Campbell  [facility]  Office: [phone]   09/12/2018, 9:19 AM

## 2018-09-12 NOTE — H&P (Signed)
[facility] History & Physical Update  The patient was interviewed and re-examined.  The patient's previous History and Physical has been reviewed and is unchanged.  There is no change in the plan of care. We plan to proceed with the scheduled procedure.  Hortencia Pilar, MD  09/12/2018, 7:23 AM

## 2018-09-15 ENCOUNTER — Encounter: Payer: Self-pay | Admitting: Vascular Surgery

## 2018-09-29 ENCOUNTER — Encounter (INDEPENDENT_AMBULATORY_CARE_PROVIDER_SITE_OTHER): Payer: Self-pay | Admitting: Vascular Surgery

## 2018-09-29 ENCOUNTER — Ambulatory Visit (INDEPENDENT_AMBULATORY_CARE_PROVIDER_SITE_OTHER): Payer: Medicare Other | Admitting: Vascular Surgery

## 2018-09-29 ENCOUNTER — Encounter (INDEPENDENT_AMBULATORY_CARE_PROVIDER_SITE_OTHER): Payer: Self-pay

## 2018-09-29 VITALS — BP 153/81 | HR 59 | Resp 14 | Ht 71.0 in | Wt 270.0 lb

## 2018-09-29 DIAGNOSIS — N184 Chronic kidney disease, stage 4 (severe): Secondary | ICD-10-CM

## 2018-09-29 NOTE — Progress Notes (Signed)
Patient ID: Tyrone Campbell, male   DOB: 12-17-1950, 67 y.o.   MRN: [id number]  No chief complaint on file.   HPI Tyrone Campbell is a 67 y.o. male.    right radiocephalic arteriovenous fistula placement on 09/12/2018.  He denies hand pain  No fever or chills   Past Medical History:  Diagnosis Date  . Chronic kidney disease    stage 4  . Colon cancer ([place]) 02-06-16   INVASIVE COLORECTAL ADENOCARCINOMA, MODERATELY DIFFERENTIATED  . Colon polyp 02-06-16   TUBULAR ADENOMA and INVASIVE COLORECTAL ADENOCARCINOMA, MODERATELY DIFFERENTIATED  . Coronary artery disease   . Diabetes mellitus without complication ([place])   . Hyperlipemia   . Hypertension   . Polycystic kidney disease   . Ventral hernia     Past Surgical History:  Procedure Laterality Date  . AV FISTULA PLACEMENT Right 09/12/2018   Procedure: ARTERIOVENOUS (AV) FISTULA CREATION ( RADIOCEPHALIC );  Surgeon: Katha Cabal, MD;  Location: [facility] ORS;  Service: Vascular;  Laterality: Right;  . COLONOSCOPY WITH PROPOFOL N/A 02/06/2016   Procedure: COLONOSCOPY WITH PROPOFOL;  Surgeon: Lollie Sails, MD;  Location: [facility] ENDOSCOPY;  Service: Endoscopy;  Laterality: N/A;  . COLONOSCOPY WITH PROPOFOL N/A 07/17/2017   Procedure: COLONOSCOPY WITH PROPOFOL;  Surgeon: Toledo, Benay Pike, MD;  Location: [facility] ENDOSCOPY;  Service: Gastroenterology;  Laterality: N/A;  . CORONARY ANGIOPLASTY    . CORONARY ARTERY BYPASS GRAFT  Jan 2010   [facility]  . FRACTURE SURGERY     left arm  . HERNIA REPAIR  1999   Dr Bary Castilla  umbilical  . MENISCUS REPAIR Right 2014  . RECTAL EUS  03/01/2016  . TONSILLECTOMY        No Known Allergies  Current Outpatient Medications  Medication Sig Dispense Refill  . aspirin 81 MG tablet Take 81 mg by mouth daily.    . carvedilol (COREG) 25 MG tablet Take 25 mg by mouth 2 (two) times daily with a meal.    . cloNIDine (CATAPRES) 0.2 MG tablet Take 0.2 mg by mouth 2 (two) times daily.    . clopidogrel  (PLAVIX) 75 MG tablet Take 75 mg by mouth daily.    . furosemide (LASIX) 20 MG tablet Take 20 mg by mouth daily.     Marland Kitchen HYDROcodone-acetaminophen (NORCO) 5-325 MG tablet Take 1-2 tablets by mouth every 6 (six) hours as needed. 50 tablet 0  . Insulin Glargine (BASAGLAR KWIKPEN) 100 UNIT/ML SOPN Inject 32 Units into the skin daily.    . insulin lispro (HUMALOG KWIKPEN) 100 UNIT/ML KwikPen Inject 10 Units into the skin daily before supper.     . isosorbide mononitrate (IMDUR) 120 MG 24 hr tablet Take 120 mg by mouth daily.    . ramipril (ALTACE) 10 MG capsule Take 10 mg by mouth 2 (two) times daily.    . sevelamer (RENAGEL) 800 MG tablet Take 800 mg by mouth 3 (three) times daily with meals.   11  . simvastatin (ZOCOR) 20 MG tablet Take 20 mg by mouth daily.    . sodium bicarbonate 650 MG tablet Take 1,300 mg by mouth 2 (two) times daily.     . tamsulosin (FLOMAX) 0.4 MG CAPS capsule Take 0.4 mg by mouth 2 (two) times daily.     Current Facility-Administered Medications  Medication Dose Route Frequency Provider Last Rate Last Dose  . vancomycin (VANCOCIN) 1,000 mg in sodium chloride 0.9 % 500 mL IVPB  1,000 mg Intravenous Once Eulogio Ditch  E, NP            Physical Exam There were no vitals taken for this visit. Gen:  WD/WN, NAD Skin: incision C/D/I; good thrill good bruit     Assessment/Plan:  1. Chronic kidney disease, stage IV (severe) (HCC) Recommend:  The patient is doing well and currently has adequate dialysis access. The patient's dialysis center is not reporting any access issues.  The patient should have a duplex ultrasound of the dialysis access in 1 month to assess whether cannulation can begin.  The patient will follow-up with me in the office after each ultrasound    - VAS [place] (AVF, AVG); Future      Hortencia Pilar 09/29/2018, 9:59 AM   This note was created with Dragon medical transcription system.  Any errors from dictation are  unintentional.

## 2018-10-02 ENCOUNTER — Encounter (INDEPENDENT_AMBULATORY_CARE_PROVIDER_SITE_OTHER): Payer: Self-pay | Admitting: Vascular Surgery

## 2018-10-07 DIAGNOSIS — D631 Anemia in chronic kidney disease: Secondary | ICD-10-CM | POA: Insufficient documentation

## 2018-10-07 DIAGNOSIS — N2581 Secondary hyperparathyroidism of renal origin: Secondary | ICD-10-CM | POA: Insufficient documentation

## 2018-10-07 DIAGNOSIS — N189 Chronic kidney disease, unspecified: Secondary | ICD-10-CM | POA: Insufficient documentation

## 2018-10-07 DIAGNOSIS — E872 Acidosis, unspecified: Secondary | ICD-10-CM | POA: Insufficient documentation

## 2018-10-30 ENCOUNTER — Encounter (INDEPENDENT_AMBULATORY_CARE_PROVIDER_SITE_OTHER): Payer: Self-pay | Admitting: Vascular Surgery

## 2018-10-30 ENCOUNTER — Ambulatory Visit (INDEPENDENT_AMBULATORY_CARE_PROVIDER_SITE_OTHER): Payer: Medicare Other

## 2018-10-30 ENCOUNTER — Ambulatory Visit (INDEPENDENT_AMBULATORY_CARE_PROVIDER_SITE_OTHER): Payer: Medicare Other | Admitting: Vascular Surgery

## 2018-10-30 VITALS — BP 122/68 | HR 72 | Resp 18 | Ht 71.0 in | Wt 269.2 lb

## 2018-10-30 DIAGNOSIS — N184 Chronic kidney disease, stage 4 (severe): Secondary | ICD-10-CM | POA: Diagnosis not present

## 2018-11-02 ENCOUNTER — Encounter (INDEPENDENT_AMBULATORY_CARE_PROVIDER_SITE_OTHER): Payer: Self-pay | Admitting: Vascular Surgery

## 2018-11-02 NOTE — Progress Notes (Signed)
Patient ID: Tyrone Campbell, male   DOB: November 01, 1950, 68 y.o.   MRN: [id number]  Chief Complaint  Patient presents with  . Follow-up    HPI Tyrone Campbell is a 68 y.o. male.    No pain at the surgery site  Patient has been monitoring his thrill and bruit   Past Medical History:  Diagnosis Date  . Chronic kidney disease    stage 4  . Colon cancer ([place]) 02-06-16   INVASIVE COLORECTAL ADENOCARCINOMA, MODERATELY DIFFERENTIATED  . Colon polyp 02-06-16   TUBULAR ADENOMA and INVASIVE COLORECTAL ADENOCARCINOMA, MODERATELY DIFFERENTIATED  . Coronary artery disease   . Diabetes mellitus without complication ([place])   . Hyperlipemia   . Hypertension   . Polycystic kidney disease   . Ventral hernia     Past Surgical History:  Procedure Laterality Date  . AV FISTULA PLACEMENT Right 09/12/2018   Procedure: ARTERIOVENOUS (AV) FISTULA CREATION ( RADIOCEPHALIC );  Surgeon: Katha Cabal, MD;  Location: [facility] ORS;  Service: Vascular;  Laterality: Right;  . COLONOSCOPY WITH PROPOFOL N/A 02/06/2016   Procedure: COLONOSCOPY WITH PROPOFOL;  Surgeon: Lollie Sails, MD;  Location: [facility] ENDOSCOPY;  Service: Endoscopy;  Laterality: N/A;  . COLONOSCOPY WITH PROPOFOL N/A 07/17/2017   Procedure: COLONOSCOPY WITH PROPOFOL;  Surgeon: Toledo, Benay Pike, MD;  Location: [facility] ENDOSCOPY;  Service: Gastroenterology;  Laterality: N/A;  . CORONARY ANGIOPLASTY    . CORONARY ARTERY BYPASS GRAFT  Jan 2010   [facility]  . FRACTURE SURGERY     left arm  . HERNIA REPAIR  1999   Dr Bary Castilla  umbilical  . MENISCUS REPAIR Right 2014  . RECTAL EUS  03/01/2016  . TONSILLECTOMY        No Known Allergies  Current Outpatient Medications  Medication Sig Dispense Refill  . aspirin 81 MG tablet Take 81 mg by mouth daily.    . calcitRIOL (ROCALTROL) 0.25 MCG capsule Take 1 capsule by mouth daily.    . carvedilol (COREG) 25 MG tablet Take 25 mg by mouth 2 (two) times daily with a meal.    . cloNIDine (CATAPRES) 0.2  MG tablet Take 0.2 mg by mouth 2 (two) times daily.    . clopidogrel (PLAVIX) 75 MG tablet Take 75 mg by mouth daily.    . furosemide (LASIX) 20 MG tablet Take 20 mg by mouth daily.     . Insulin Glargine (BASAGLAR KWIKPEN) 100 UNIT/ML SOPN Inject 32 Units into the skin daily.    . insulin lispro (HUMALOG KWIKPEN) 100 UNIT/ML KwikPen Inject 10 Units into the skin daily before supper.     . isosorbide mononitrate (IMDUR) 120 MG 24 hr tablet Take 120 mg by mouth daily.    . ramipril (ALTACE) 10 MG capsule Take 10 mg by mouth 2 (two) times daily.    . sevelamer (RENAGEL) 800 MG tablet Take 800 mg by mouth 3 (three) times daily with meals.   11  . simvastatin (ZOCOR) 20 MG tablet Take 20 mg by mouth daily.    . sodium bicarbonate 650 MG tablet Take 1,300 mg by mouth 2 (two) times daily.     . tamsulosin (FLOMAX) 0.4 MG CAPS capsule Take 0.4 mg by mouth 2 (two) times daily.     Current Facility-Administered Medications  Medication Dose Route Frequency Provider Last Rate Last Dose  . vancomycin (VANCOCIN) 1,000 mg in sodium chloride 0.9 % 500 mL IVPB  1,000 mg Intravenous Once Kris Hartmann, NP  Physical Exam BP 122/68 (BP Location: Left Arm, Patient Position: Sitting)   Pulse 72   Resp 18   Ht 5\' 11"  (1.803 m)   Wt 269 lb 3.2 oz (122.1 kg)   BMI 37.55 kg/m  Gen:  WD/WN, NAD Skin: incision C/D/I; good thrill good bruit  Cephalic vein is easity palpable to the proximal forearm     Assessment/Plan: 1. Chronic kidney disease, stage IV (severe) ([place]) Follow up in 2 months no studies  Patient not yet on dialysis but I believe that by three months post op he should be good to go.      Hortencia Pilar 11/02/2018, 1:36 PM   This note was created with Dragon medical transcription system.  Any errors from dictation are unintentional.

## 2018-12-29 ENCOUNTER — Ambulatory Visit (INDEPENDENT_AMBULATORY_CARE_PROVIDER_SITE_OTHER): Payer: Medicare Other | Admitting: Vascular Surgery

## 2019-01-19 ENCOUNTER — Encounter (INDEPENDENT_AMBULATORY_CARE_PROVIDER_SITE_OTHER): Payer: Self-pay | Admitting: Vascular Surgery

## 2019-01-19 ENCOUNTER — Ambulatory Visit (INDEPENDENT_AMBULATORY_CARE_PROVIDER_SITE_OTHER): Payer: Medicare Other | Admitting: Vascular Surgery

## 2019-01-19 ENCOUNTER — Other Ambulatory Visit: Payer: Self-pay

## 2019-01-19 VITALS — BP 158/73 | HR 67 | Resp 16 | Ht 71.0 in | Wt 277.0 lb

## 2019-01-19 DIAGNOSIS — E669 Obesity, unspecified: Secondary | ICD-10-CM

## 2019-01-19 DIAGNOSIS — E1122 Type 2 diabetes mellitus with diabetic chronic kidney disease: Secondary | ICD-10-CM | POA: Diagnosis not present

## 2019-01-19 DIAGNOSIS — N184 Chronic kidney disease, stage 4 (severe): Secondary | ICD-10-CM

## 2019-01-19 DIAGNOSIS — I10 Essential (primary) hypertension: Secondary | ICD-10-CM

## 2019-01-19 DIAGNOSIS — E1169 Type 2 diabetes mellitus with other specified complication: Secondary | ICD-10-CM

## 2019-01-19 DIAGNOSIS — I129 Hypertensive chronic kidney disease with stage 1 through stage 4 chronic kidney disease, or unspecified chronic kidney disease: Secondary | ICD-10-CM

## 2019-01-19 DIAGNOSIS — Z79899 Other long term (current) drug therapy: Secondary | ICD-10-CM

## 2019-01-19 DIAGNOSIS — Z87891 Personal history of nicotine dependence: Secondary | ICD-10-CM

## 2019-01-19 NOTE — Progress Notes (Signed)
MRN : [id number]  Tyrone Campbell is a 68 y.o. (October 15, 1950) male who presents with chief complaint of No chief complaint on file. Marland Kitchen  History of Present Illness:   rightradiocephalic arteriovenous fistula placement on 09/12/2018.  He is not yet on dialysis.  He denies hand pain  No fever or chills  He does admit to moderate uremic symptoms.  No outpatient medications have been marked as taking for the 01/19/19 encounter (Appointment) with Delana Meyer, Dolores Lory, MD.   Current Facility-Administered Medications for the 01/19/19 encounter (Appointment) with Delana Meyer, Dolores Lory, MD  Medication  . vancomycin (VANCOCIN) 1,000 mg in sodium chloride 0.9 % 500 mL IVPB    Past Medical History:  Diagnosis Date  . Chronic kidney disease    stage 4  . Colon cancer ([place]) 02-06-16   INVASIVE COLORECTAL ADENOCARCINOMA, MODERATELY DIFFERENTIATED  . Colon polyp 02-06-16   TUBULAR ADENOMA and INVASIVE COLORECTAL ADENOCARCINOMA, MODERATELY DIFFERENTIATED  . Coronary artery disease   . Diabetes mellitus without complication ([place])   . Hyperlipemia   . Hypertension   . Polycystic kidney disease   . Ventral hernia     Past Surgical History:  Procedure Laterality Date  . AV FISTULA PLACEMENT Right 09/12/2018   Procedure: ARTERIOVENOUS (AV) FISTULA CREATION ( RADIOCEPHALIC );  Surgeon: Katha Cabal, MD;  Location: [facility] ORS;  Service: Vascular;  Laterality: Right;  . COLONOSCOPY WITH PROPOFOL N/A 02/06/2016   Procedure: COLONOSCOPY WITH PROPOFOL;  Surgeon: Lollie Sails, MD;  Location: [facility] ENDOSCOPY;  Service: Endoscopy;  Laterality: N/A;  . COLONOSCOPY WITH PROPOFOL N/A 07/17/2017   Procedure: COLONOSCOPY WITH PROPOFOL;  Surgeon: Toledo, Benay Pike, MD;  Location: [facility] ENDOSCOPY;  Service: Gastroenterology;  Laterality: N/A;  . CORONARY ANGIOPLASTY    . CORONARY ARTERY BYPASS GRAFT  Jan 2010   [facility]  . FRACTURE SURGERY     left arm  . HERNIA REPAIR  1999   Dr Bary Castilla  umbilical  .  MENISCUS REPAIR Right 2014  . RECTAL EUS  03/01/2016  . TONSILLECTOMY      Social History Social History   Tobacco Use  . Smoking status: Former Smoker    Packs/day: 0.25    Years: 2.00    Pack years: 0.50    Types: Cigarettes    Last attempt to quit: 02/28/1975    Years since quitting: 43.9  . Smokeless tobacco: Never Used  Substance Use Topics  . Alcohol use: Yes    Alcohol/week: 0.0 standard drinks    Comment: OCCASIONAL/ none in last year   . Drug use: No    Family History Family History  Problem Relation Age of Onset  . Stroke Father   . Polycystic kidney disease Mother     No Known Allergies   REVIEW OF SYSTEMS (Negative unless checked)  Constitutional: [] Weight loss  [] Fever  [] Chills Cardiac: [] Chest pain   [] Chest pressure   [] Palpitations   [] Shortness of breath when laying flat   [] Shortness of breath with exertion. Vascular:  [] Pain in legs with walking   [] Pain in legs at rest  [] History of DVT   [] Phlebitis   [] Swelling in legs   [] Varicose veins   [] Non-healing ulcers Pulmonary:   [] Uses home oxygen   [] Productive cough   [] Hemoptysis   [] Wheeze  [] COPD   [] Asthma Neurologic:  [] Dizziness   [] Seizures   [] History of stroke   [] History of TIA  [] Aphasia   [] Vissual changes   [] Weakness or numbness in arm   []   Weakness or numbness in leg Musculoskeletal:   [] Joint swelling   [] Joint pain   [] Low back pain Hematologic:  [] Easy bruising  [] Easy bleeding   [] Hypercoagulable state   [] Anemic Gastrointestinal:  [] Diarrhea   [] Vomiting  [] Gastroesophageal reflux/heartburn   [] Difficulty swallowing. Genitourinary:  [x] Chronic kidney disease   [] Difficult urination  [] Frequent urination   [] Blood in urine Skin:  [] Rashes   [] Ulcers  Psychological:  [] History of anxiety   []  History of major depression.  Physical Examination  There were no vitals filed for this visit. There is no height or weight on file to calculate BMI. Gen: WD/WN, NAD Head: [place]/AT, No  temporalis wasting.  Ear/Nose/Throat: Hearing grossly intact, nares w/o erythema or drainage Eyes: PER, EOMI, sclera nonicteric.  Neck: Supple, no large masses.   Pulmonary:  Good air movement, no audible wheezing bilaterally, no use of accessory muscles.  Cardiac: RRR, no JVD Vascular:  Right radialcephalic fistula good thrill good bruit Vessel Right Left  Radial Palpable Palpable  Brachial Palpable Palpable  Gastrointestinal: Non-distended. No guarding/no peritoneal signs.  Musculoskeletal: M/S 5/5 throughout.  No deformity or atrophy.  Neurologic: CN 2-12 intact. Symmetrical.  Speech is fluent. Motor exam as listed above. Psychiatric: Judgment intact, Mood & affect appropriate for pt's clinical situation. Dermatologic: No rashes or ulcers noted.  No changes consistent with cellulitis. Lymph : No lichenification or skin changes of chronic lymphedema.  CBC Lab Results  Component Value Date   WBC 5.8 09/05/2018   HGB 10.6 (L) 09/05/2018   HCT 31.9 (L) 09/05/2018   MCV 87.4 09/05/2018   PLT 169 09/05/2018    BMET    Component Value Date/Time   NA 139 09/05/2018 1510   K 4.4 09/05/2018 1510   CL 106 09/05/2018 1510   CO2 23 09/05/2018 1510   GLUCOSE 116 (H) 09/05/2018 1510   BUN 97 (H) 09/05/2018 1510   CREATININE 8.61 (H) 09/05/2018 1510   CALCIUM 8.5 (L) 09/05/2018 1510   GFRNONAA 6 (L) 09/05/2018 1510   GFRAA 7 (L) 09/05/2018 1510   CrCl cannot be calculated (Patient's most recent lab result is older than the maximum 21 days allowed.).  COAG Lab Results  Component Value Date   INR 1.15 09/05/2018    Radiology No results found.    Assessment/Plan 1. Chronic kidney disease, stage IV (severe) (HCC) Recommend:  The patient is doing well and currently has adequate dialysis access. The patient's dialysis center is not reporting any access issues. Flow pattern is stable when compared to the prior ultrasound.  The patient should have a duplex ultrasound of the  dialysis access in 6 months. The patient will follow-up with me in the office after each ultrasound   - VAS [place] (AVF, AVG); Future  2. Essential hypertension Continue antihypertensive medications as already ordered, these medications have been reviewed and there are no changes at this time.   3. Diabetes mellitus type 2 in obese [facility]) Continue hypoglycemic medications as already ordered, these medications have been reviewed and there are no changes at this time.  Hgb A1C to be monitored as already arranged by primary service     Hortencia Pilar, MD  01/19/2019 11:28 AM

## 2019-07-23 ENCOUNTER — Ambulatory Visit (INDEPENDENT_AMBULATORY_CARE_PROVIDER_SITE_OTHER): Payer: Medicare Other | Admitting: Vascular Surgery

## 2019-07-23 ENCOUNTER — Encounter (INDEPENDENT_AMBULATORY_CARE_PROVIDER_SITE_OTHER): Payer: Medicare Other

## 2019-09-03 ENCOUNTER — Encounter (INDEPENDENT_AMBULATORY_CARE_PROVIDER_SITE_OTHER): Payer: Self-pay | Admitting: Vascular Surgery

## 2019-09-03 ENCOUNTER — Ambulatory Visit (INDEPENDENT_AMBULATORY_CARE_PROVIDER_SITE_OTHER): Payer: Medicare Other | Admitting: Vascular Surgery

## 2019-09-03 ENCOUNTER — Other Ambulatory Visit: Payer: Self-pay

## 2019-09-03 ENCOUNTER — Ambulatory Visit (INDEPENDENT_AMBULATORY_CARE_PROVIDER_SITE_OTHER): Payer: Medicare Other

## 2019-09-03 VITALS — BP 158/75 | HR 57 | Resp 16 | Wt 276.0 lb

## 2019-09-03 DIAGNOSIS — N184 Chronic kidney disease, stage 4 (severe): Secondary | ICD-10-CM | POA: Diagnosis not present

## 2019-09-03 DIAGNOSIS — I25118 Atherosclerotic heart disease of native coronary artery with other forms of angina pectoris: Secondary | ICD-10-CM

## 2019-09-03 DIAGNOSIS — E669 Obesity, unspecified: Secondary | ICD-10-CM

## 2019-09-03 DIAGNOSIS — E1169 Type 2 diabetes mellitus with other specified complication: Secondary | ICD-10-CM

## 2019-09-03 DIAGNOSIS — I10 Essential (primary) hypertension: Secondary | ICD-10-CM | POA: Diagnosis not present

## 2019-09-03 NOTE — Progress Notes (Signed)
MRN : [id number]  Tyrone Campbell is a 68 y.o. (Jul 25, 1951) male who presents with chief complaint of No chief complaint on file. Marland Kitchen  History of Present Illness:   09/12/2018 a right radiocephalic arteriovenous fistula was created.  He is not yet on dialysis.  He denies hand pain  No fever or chills  No outpatient medications have been marked as taking for the 09/03/19 encounter (Appointment) with Delana Meyer, Dolores Lory, MD.   Current Facility-Administered Medications for the 09/03/19 encounter (Appointment) with Delana Meyer, Dolores Lory, MD  Medication  . vancomycin (VANCOCIN) 1,000 mg in sodium chloride 0.9 % 500 mL IVPB    Past Medical History:  Diagnosis Date  . Chronic kidney disease    stage 4  . Colon cancer ([place]) 02-06-16   INVASIVE COLORECTAL ADENOCARCINOMA, MODERATELY DIFFERENTIATED  . Colon polyp 02-06-16   TUBULAR ADENOMA and INVASIVE COLORECTAL ADENOCARCINOMA, MODERATELY DIFFERENTIATED  . Coronary artery disease   . Diabetes mellitus without complication ([place])   . Hyperlipemia   . Hypertension   . Polycystic kidney disease   . Ventral hernia     Past Surgical History:  Procedure Laterality Date  . AV FISTULA PLACEMENT Right 09/12/2018   Procedure: ARTERIOVENOUS (AV) FISTULA CREATION ( RADIOCEPHALIC );  Surgeon: Katha Cabal, MD;  Location: [facility] ORS;  Service: Vascular;  Laterality: Right;  . COLONOSCOPY WITH PROPOFOL N/A 02/06/2016   Procedure: COLONOSCOPY WITH PROPOFOL;  Surgeon: Lollie Sails, MD;  Location: [facility] ENDOSCOPY;  Service: Endoscopy;  Laterality: N/A;  . COLONOSCOPY WITH PROPOFOL N/A 07/17/2017   Procedure: COLONOSCOPY WITH PROPOFOL;  Surgeon: Toledo, Benay Pike, MD;  Location: [facility] ENDOSCOPY;  Service: Gastroenterology;  Laterality: N/A;  . CORONARY ANGIOPLASTY    . CORONARY ARTERY BYPASS GRAFT  Jan 2010   [facility]  . FRACTURE SURGERY     left arm  . HERNIA REPAIR  1999   Dr Bary Castilla  umbilical  . MENISCUS REPAIR Right 2014  . RECTAL EUS   03/01/2016  . TONSILLECTOMY      Social History Social History   Tobacco Use  . Smoking status: Former Smoker    Packs/day: 0.25    Years: 2.00    Pack years: 0.50    Types: Cigarettes    Quit date: 02/28/1975    Years since quitting: 44.5  . Smokeless tobacco: Never Used  Substance Use Topics  . Alcohol use: Yes    Alcohol/week: 0.0 standard drinks    Comment: OCCASIONAL/ none in last year   . Drug use: No    Family History Family History  Problem Relation Age of Onset  . Stroke Father   . Polycystic kidney disease Mother     No Known Allergies   REVIEW OF SYSTEMS (Negative unless checked)  Constitutional: [] Weight loss  [] Fever  [] Chills Cardiac: [] Chest pain   [] Chest pressure   [] Palpitations   [] Shortness of breath when laying flat   [] Shortness of breath with exertion. Vascular:  [] Pain in legs with walking   [] Pain in legs at rest  [] History of DVT   [] Phlebitis   [] Swelling in legs   [] Varicose veins   [] Non-healing ulcers Pulmonary:   [] Uses home oxygen   [] Productive cough   [] Hemoptysis   [] Wheeze  [] COPD   [] Asthma Neurologic:  [] Dizziness   [] Seizures   [] History of stroke   [] History of TIA  [] Aphasia   [] Vissual changes   [] Weakness or numbness in arm   [] Weakness or numbness in leg Musculoskeletal:   []   Joint swelling   [] Joint pain   [] Low back pain Hematologic:  [] Easy bruising  [] Easy bleeding   [] Hypercoagulable state   [] Anemic Gastrointestinal:  [] Diarrhea   [] Vomiting  [] Gastroesophageal reflux/heartburn   [] Difficulty swallowing. Genitourinary:  [x] Chronic kidney disease   [] Difficult urination  [] Frequent urination   [] Blood in urine Skin:  [] Rashes   [] Ulcers  Psychological:  [] History of anxiety   []  History of major depression.  Physical Examination  There were no vitals filed for this visit. There is no height or weight on file to calculate BMI. Gen: WD/WN, NAD Head: [place]/AT, No temporalis wasting.  Ear/Nose/Throat: Hearing grossly intact,  nares w/o erythema or drainage Eyes: PER, EOMI, sclera nonicteric.  Neck: Supple, no large masses.   Pulmonary:  Good air movement, no audible wheezing bilaterally, no use of accessory muscles.  Cardiac: RRR, no JVD Vascular: good thrill and good bruit Vessel Right Left  Radial Palpable Palpable  Ulnar Palpable Palpable  Brachial Palpable Palpable  Gastrointestinal: Non-distended. No guarding/no peritoneal signs.  Musculoskeletal: M/S 5/5 throughout.  No deformity or atrophy.  Neurologic: CN 2-12 intact. Symmetrical.  Speech is fluent. Motor exam as listed above. Psychiatric: Judgment intact, Mood & affect appropriate for pt's clinical situation. Dermatologic: No rashes or ulcers noted.  No changes consistent with cellulitis. Lymph : No lichenification or skin changes of chronic lymphedema.  CBC Lab Results  Component Value Date   WBC 5.8 09/05/2018   HGB 10.6 (L) 09/05/2018   HCT 31.9 (L) 09/05/2018   MCV 87.4 09/05/2018   PLT 169 09/05/2018    BMET    Component Value Date/Time   NA 139 09/05/2018 1510   K 4.4 09/05/2018 1510   CL 106 09/05/2018 1510   CO2 23 09/05/2018 1510   GLUCOSE 116 (H) 09/05/2018 1510   BUN 97 (H) 09/05/2018 1510   CREATININE 8.61 (H) 09/05/2018 1510   CALCIUM 8.5 (L) 09/05/2018 1510   GFRNONAA 6 (L) 09/05/2018 1510   GFRAA 7 (L) 09/05/2018 1510   CrCl cannot be calculated (Patient's most recent lab result is older than the maximum 21 days allowed.).  COAG Lab Results  Component Value Date   INR 1.15 09/05/2018    Radiology No results found.   Assessment/Plan 1. Chronic kidney disease, stage IV (severe) (HCC) Recommend:  The patient is doing well and currently has adequate dialysis access. The patient's dialysis center is not reporting any access issues. Flow pattern is stable when compared to the prior ultrasound.  The patient should have a duplex ultrasound of the dialysis access in 6 months. The patient will follow-up with me  in the office after each ultrasound   - DIALYSIS ACCESS (AVF; Future  2. 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  Hgb A1C to be monitored as already arranged by primary service   3. Atherosclerosis of native coronary artery of native heart with stable angina pectoris (HCC) Continue cardiac and antihypertensive medications as already ordered and reviewed, no changes at this time.  Continue statin as ordered and reviewed, no changes at this time  Nitrates PRN for chest pain   4. Essential hypertension Continue antihypertensive medications as already ordered, these medications have been reviewed and there are no changes at this time.     Hortencia Pilar, MD  09/03/2019 2:02 PM

## 2019-10-05 ENCOUNTER — Ambulatory Visit (INDEPENDENT_AMBULATORY_CARE_PROVIDER_SITE_OTHER): Payer: Medicare Other | Admitting: Vascular Surgery

## 2019-10-05 ENCOUNTER — Other Ambulatory Visit: Payer: Self-pay

## 2019-10-05 ENCOUNTER — Other Ambulatory Visit (INDEPENDENT_AMBULATORY_CARE_PROVIDER_SITE_OTHER): Payer: Medicare Other

## 2019-10-05 ENCOUNTER — Other Ambulatory Visit (INDEPENDENT_AMBULATORY_CARE_PROVIDER_SITE_OTHER): Payer: Self-pay | Admitting: Vascular Surgery

## 2019-10-05 ENCOUNTER — Encounter (INDEPENDENT_AMBULATORY_CARE_PROVIDER_SITE_OTHER): Payer: Self-pay | Admitting: Vascular Surgery

## 2019-10-05 VITALS — BP 162/75 | HR 76 | Resp 16 | Wt 278.0 lb

## 2019-10-05 DIAGNOSIS — Z992 Dependence on renal dialysis: Secondary | ICD-10-CM

## 2019-10-05 DIAGNOSIS — E669 Obesity, unspecified: Secondary | ICD-10-CM

## 2019-10-05 DIAGNOSIS — I10 Essential (primary) hypertension: Secondary | ICD-10-CM

## 2019-10-05 DIAGNOSIS — D689 Coagulation defect, unspecified: Secondary | ICD-10-CM | POA: Insufficient documentation

## 2019-10-05 DIAGNOSIS — K635 Polyp of colon: Secondary | ICD-10-CM | POA: Insufficient documentation

## 2019-10-05 DIAGNOSIS — I129 Hypertensive chronic kidney disease with stage 1 through stage 4 chronic kidney disease, or unspecified chronic kidney disease: Secondary | ICD-10-CM | POA: Insufficient documentation

## 2019-10-05 DIAGNOSIS — T829XXD Unspecified complication of cardiac and vascular prosthetic device, implant and graft, subsequent encounter: Secondary | ICD-10-CM | POA: Diagnosis not present

## 2019-10-05 DIAGNOSIS — N186 End stage renal disease: Secondary | ICD-10-CM

## 2019-10-05 DIAGNOSIS — D509 Iron deficiency anemia, unspecified: Secondary | ICD-10-CM | POA: Insufficient documentation

## 2019-10-05 DIAGNOSIS — E1169 Type 2 diabetes mellitus with other specified complication: Secondary | ICD-10-CM | POA: Diagnosis not present

## 2019-10-05 DIAGNOSIS — T829XXA Unspecified complication of cardiac and vascular prosthetic device, implant and graft, initial encounter: Secondary | ICD-10-CM | POA: Insufficient documentation

## 2019-10-05 DIAGNOSIS — I25118 Atherosclerotic heart disease of native coronary artery with other forms of angina pectoris: Secondary | ICD-10-CM

## 2019-10-05 NOTE — Progress Notes (Signed)
MRN : [id number]  Tyrone Campbell is a 69 y.o. ([birth year]) male who presents with chief complaint of  Chief Complaint  Patient presents with  . Follow-up  .  History of Present Illness:  The patient is seen for evaluation of dialysis access.    He is status post creation of a right radiocephalic fistula September 12, 2018.  He denies hand pain.  He denies pain or redness at the fistula site   The no history of amaurosis fugax or recent TIA symptoms. There are no documented recent neurological changes noted. The no history of claudication symptoms or rest pain symptoms. The no history of DVT, PE or superficial thrombophlebitis.     Current Meds  Medication Sig  . aspirin 81 MG tablet Take 81 mg by mouth daily.  . calcitRIOL (ROCALTROL) 0.25 MCG capsule Take 1 capsule by mouth daily.  . carvedilol (COREG) 25 MG tablet Take 25 mg by mouth 2 (two) times daily with a meal.  . cloNIDine (CATAPRES) 0.2 MG tablet Take 0.2 mg by mouth 2 (two) times daily.  . clopidogrel (PLAVIX) 75 MG tablet Take 75 mg by mouth daily.  . furosemide (LASIX) 20 MG tablet Take 20 mg by mouth daily.   . hydrALAZINE (APRESOLINE) 25 MG tablet   . Insulin Glargine (BASAGLAR KWIKPEN) 100 UNIT/ML SOPN Inject 32 Units into the skin daily.  . insulin lispro (HUMALOG KWIKPEN) 100 UNIT/ML KwikPen Inject 10 Units into the skin daily before supper.   . isosorbide mononitrate (IMDUR) 120 MG 24 hr tablet Take 120 mg by mouth daily.  . ramipril (ALTACE) 10 MG capsule Take 10 mg by mouth 2 (two) times daily.  . sevelamer (RENAGEL) 800 MG tablet Take 800 mg by mouth 3 (three) times daily with meals.   . simvastatin (ZOCOR) 20 MG tablet Take 20 mg by mouth daily.  . sodium bicarbonate 650 MG tablet Take by mouth.  . tamsulosin (FLOMAX) 0.4 MG CAPS capsule Take 0.4 mg by mouth 2 (two) times daily.   Current Facility-Administered Medications for the 10/05/19 encounter (Office Visit) with Delana Meyer, Dolores Lory, MD    Medication  . vancomycin (VANCOCIN) 1,000 mg in sodium chloride 0.9 % 500 mL IVPB    Past Medical History:  Diagnosis Date  . Chronic kidney disease    stage 4  . Colon cancer ([place]) 02-06-16   INVASIVE COLORECTAL ADENOCARCINOMA, MODERATELY DIFFERENTIATED  . Colon polyp 02-06-16   TUBULAR ADENOMA and INVASIVE COLORECTAL ADENOCARCINOMA, MODERATELY DIFFERENTIATED  . Coronary artery disease   . Diabetes mellitus without complication ([place])   . Hyperlipemia   . Hypertension   . Polycystic kidney disease   . Ventral hernia     Past Surgical History:  Procedure Laterality Date  . AV FISTULA PLACEMENT Right 09/12/2018   Procedure: ARTERIOVENOUS (AV) FISTULA CREATION ( RADIOCEPHALIC );  Surgeon: Katha Cabal, MD;  Location: [facility] ORS;  Service: Vascular;  Laterality: Right;  . COLONOSCOPY WITH PROPOFOL N/A 02/06/2016   Procedure: COLONOSCOPY WITH PROPOFOL;  Surgeon: Lollie Sails, MD;  Location: [facility] ENDOSCOPY;  Service: Endoscopy;  Laterality: N/A;  . COLONOSCOPY WITH PROPOFOL N/A 07/17/2017   Procedure: COLONOSCOPY WITH PROPOFOL;  Surgeon: Toledo, Benay Pike, MD;  Location: [facility] ENDOSCOPY;  Service: Gastroenterology;  Laterality: N/A;  . CORONARY ANGIOPLASTY    . CORONARY ARTERY BYPASS GRAFT  Jan 2010   [facility]  . FRACTURE SURGERY     left arm  . HERNIA REPAIR  1999   Dr  Byrnett  umbilical  . MENISCUS REPAIR Right 2014  . RECTAL EUS  03/01/2016  . TONSILLECTOMY      Social History Social History   Tobacco Use  . Smoking status: Former Smoker    Packs/day: 0.25    Years: 2.00    Pack years: 0.50    Types: Cigarettes    Quit date: 02/28/1975    Years since quitting: 44.6  . Smokeless tobacco: Never Used  Substance Use Topics  . Alcohol use: Yes    Alcohol/week: 0.0 standard drinks    Comment: OCCASIONAL/ none in last year   . Drug use: No    Family History Family History  Problem Relation Age of Onset  . Stroke Father   . Polycystic kidney disease Mother     No  Known Allergies   REVIEW OF SYSTEMS (Negative unless checked)  Constitutional: [] Weight loss  [] Fever  [] Chills Cardiac: [] Chest pain   [] Chest pressure   [] Palpitations   [] Shortness of breath when laying flat   [] Shortness of breath with exertion. Vascular:  [] Pain in legs with walking   [] Pain in legs at rest  [] History of DVT   [] Phlebitis   [] Swelling in legs   [] Varicose veins   [] Non-healing ulcers Pulmonary:   [] Uses home oxygen   [] Productive cough   [] Hemoptysis   [] Wheeze  [] COPD   [] Asthma Neurologic:  [] Dizziness   [] Seizures   [] History of stroke   [] History of TIA  [] Aphasia   [] Vissual changes   [] Weakness or numbness in arm   [] Weakness or numbness in leg Musculoskeletal:   [] Joint swelling   [] Joint pain   [] Low back pain Hematologic:  [] Easy bruising  [] Easy bleeding   [] Hypercoagulable state   [] Anemic Gastrointestinal:  [] Diarrhea   [] Vomiting  [] Gastroesophageal reflux/heartburn   [] Difficulty swallowing. Genitourinary:  [x] Chronic kidney disease   [] Difficult urination  [] Frequent urination   [] Blood in urine Skin:  [] Rashes   [] Ulcers  Psychological:  [] History of anxiety   []  History of major depression.  Physical Examination  Vitals:   10/05/19 0906  BP: (!) 162/75  Pulse: 76  Resp: 16  Weight: 278 lb (126.1 kg)   Body mass index is 38.77 kg/m. Gen: WD/WN, NAD Head: [place]/AT, No temporalis wasting.  Ear/Nose/Throat: Hearing grossly intact, nares w/o erythema or drainage Eyes: PER, EOMI, sclera nonicteric.  Neck: Supple, no large masses.   Pulmonary:  Good air movement, no audible wheezing bilaterally, no use of accessory muscles.  Cardiac: RRR, no JVD Vascular: Right radiocephalic fistula good thrill good bruit vein is palpable to the proximal forearm Vessel Right Left  Radial Palpable Palpable  Gastrointestinal: Non-distended. No guarding/no peritoneal signs.  Musculoskeletal: M/S 5/5 throughout.  No deformity or atrophy.  Neurologic: CN 2-12 intact.  Symmetrical.  Speech is fluent. Motor exam as listed above. Psychiatric: Judgment intact, Mood & affect appropriate for pt's clinical situation. Dermatologic: No rashes or ulcers noted.  No changes consistent with cellulitis. Lymph : No lichenification or skin changes of chronic lymphedema.  CBC Lab Results  Component Value Date   WBC 5.8 09/05/2018   HGB 10.6 (L) 09/05/2018   HCT 31.9 (L) 09/05/2018   MCV 87.4 09/05/2018   PLT 169 09/05/2018    BMET    Component Value Date/Time   NA 139 09/05/2018 1510   K 4.4 09/05/2018 1510   CL 106 09/05/2018 1510   CO2 23 09/05/2018 1510   GLUCOSE 116 (H) 09/05/2018 1510   BUN 97 (  H) 09/05/2018 1510   CREATININE 8.61 (H) 09/05/2018 1510   CALCIUM 8.5 (L) 09/05/2018 1510   GFRNONAA 6 (L) 09/05/2018 1510   GFRAA 7 (L) 09/05/2018 1510   CrCl cannot be calculated (Patient's most recent lab result is older than the maximum 21 days allowed.).  COAG Lab Results  Component Value Date   INR 1.15 09/05/2018    Radiology No results found.   Assessment/Plan 1. End stage renal disease ([place]) OK to begin cannulation  2. Complication of vascular access for dialysis, subsequent encounter Recommend:  The patient is doing well and currently has adequate dialysis access. The patient's dialysis center is not reporting any access issues. Flow pattern is stable when compared to the prior ultrasound.  The patient should have a duplex ultrasound of the dialysis access in 6 months. The patient will follow-up with me in the office after each ultrasound    - DIALYSIS ACCESS (AVF; Future  3. Diabetes mellitus type 2 in obese [facility]) Continue hypoglycemic medications as already ordered, these medications have been reviewed and there are no changes at this time.  Hgb A1C to be monitored as already arranged by primary service   4. Essential hypertension Continue antihypertensive medications as already ordered, these medications have been reviewed  and there are no changes at this time.   5. Atherosclerosis of native coronary artery of native heart with stable angina pectoris ([place]) Continue cardiac and antihypertensive medications as already ordered and reviewed, no changes at this time.  Continue statin as ordered and reviewed, no changes at this time  Nitrates PRN for chest pain      Hortencia Pilar, MD  10/05/2019 9:35 AM

## 2019-11-06 ENCOUNTER — Ambulatory Visit: Payer: Medicare Other | Attending: Internal Medicine

## 2019-11-06 DIAGNOSIS — Z23 Encounter for immunization: Secondary | ICD-10-CM

## 2019-11-06 NOTE — Progress Notes (Signed)
   Covid-19 Vaccination Clinic  Name:  Tyrone Campbell    MRN: [id number] DOB: Feb 22, 1951  11/06/2019  Mr. Tyrone Campbell was observed post Covid-19 immunization for 15 minutes without incidence. He was provided with Vaccine Information Sheet and instruction to access the V-Safe system.   Mr. Tyrone Campbell was instructed to call 911 with any severe reactions post vaccine: Marland Kitchen Difficulty breathing  . Swelling of your face and throat  . A fast heartbeat  . A bad rash all over your body  . Dizziness and weakness    Immunizations Administered    Name Date Dose VIS Date Route   Moderna COVID-19 Vaccine 11/06/2019  2:13 PM 0.5 mL 09/01/2019 Intramuscular   Manufacturer: Moderna   Lot: [id number]   [place]: [phone]

## 2019-12-08 ENCOUNTER — Ambulatory Visit: Payer: Medicare Other | Attending: Internal Medicine

## 2019-12-08 DIAGNOSIS — Z23 Encounter for immunization: Secondary | ICD-10-CM

## 2019-12-08 NOTE — Progress Notes (Signed)
   Covid-19 Vaccination Clinic  Name:  Tyrone Campbell    MRN: [id number] DOB: 1950/11/15  12/08/2019  Tyrone Campbell was observed post Covid-19 immunization for 15 minutes without incident. He was provided with Vaccine Information Sheet and instruction to access the V-Safe system.   Tyrone Campbell was instructed to call 911 with any severe reactions post vaccine: Marland Kitchen Difficulty breathing  . Swelling of face and throat  . A fast heartbeat  . A bad rash all over body  . Dizziness and weakness   Immunizations Administered    Name Date Dose VIS Date Route   Moderna COVID-19 Vaccine 12/08/2019  3:20 PM 0.5 mL 09/01/2019 Intramuscular   Manufacturer: Moderna   Lot: [id number]   [place]: [id number]

## 2019-12-09 ENCOUNTER — Ambulatory Visit: Payer: Medicare Other

## 2020-03-10 ENCOUNTER — Ambulatory Visit (INDEPENDENT_AMBULATORY_CARE_PROVIDER_SITE_OTHER): Payer: Medicare Other | Admitting: Vascular Surgery

## 2020-03-10 ENCOUNTER — Encounter (INDEPENDENT_AMBULATORY_CARE_PROVIDER_SITE_OTHER): Payer: Medicare Other

## 2020-03-25 ENCOUNTER — Encounter (INDEPENDENT_AMBULATORY_CARE_PROVIDER_SITE_OTHER): Payer: Self-pay | Admitting: Nurse Practitioner

## 2020-03-25 ENCOUNTER — Other Ambulatory Visit: Payer: Self-pay

## 2020-03-25 ENCOUNTER — Ambulatory Visit (INDEPENDENT_AMBULATORY_CARE_PROVIDER_SITE_OTHER): Payer: Medicare Other | Admitting: Nurse Practitioner

## 2020-03-25 ENCOUNTER — Ambulatory Visit (INDEPENDENT_AMBULATORY_CARE_PROVIDER_SITE_OTHER): Payer: Medicare Other

## 2020-03-25 VITALS — BP 162/81 | HR 73 | Resp 20 | Ht 71.0 in | Wt 253.0 lb

## 2020-03-25 DIAGNOSIS — T829XXD Unspecified complication of cardiac and vascular prosthetic device, implant and graft, subsequent encounter: Secondary | ICD-10-CM | POA: Diagnosis not present

## 2020-03-25 DIAGNOSIS — I10 Essential (primary) hypertension: Secondary | ICD-10-CM

## 2020-03-25 DIAGNOSIS — E1165 Type 2 diabetes mellitus with hyperglycemia: Secondary | ICD-10-CM

## 2020-03-25 DIAGNOSIS — N186 End stage renal disease: Secondary | ICD-10-CM

## 2020-03-25 DIAGNOSIS — I25118 Atherosclerotic heart disease of native coronary artery with other forms of angina pectoris: Secondary | ICD-10-CM

## 2020-03-25 NOTE — Progress Notes (Signed)
Subjective:    Patient ID: Tyrone Campbell, male    DOB: 27-Oct-1950, 69 y.o.   MRN: [id number] Chief Complaint  Patient presents with  . Follow-up    The patient returns to the office for followup of their dialysis access. The function of the access has been stable. The patient denies increased bleeding time or increased recirculation. Patient denies difficulty with cannulation. The patient denies hand pain or other symptoms consistent with steal phenomena.  No significant arm swelling.  The patient notes that when they begin cannulating him in January they had significant issues however within the last few months the cannulating has been improved and he has been dialyzing well.  The patient denies redness or swelling at the access site. The patient denies fever or chills at home or while on dialysis.  The patient denies amaurosis fugax or recent TIA symptoms. There are no recent neurological changes noted. The patient denies claudication symptoms or rest pain symptoms. The patient denies history of DVT, PE or superficial thrombophlebitis. The patient denies recent episodes of angina or shortness of breath.   The patient today has a flow volume of 1076.  The AV fistula has some increased velocities in the mid to distal forearm, with evidence of narrowing.      Review of Systems  All other systems reviewed and are negative.      Objective:   Physical Exam Vitals reviewed.  HENT:     Head: Normocephalic.  Cardiovascular:     Rate and Rhythm: Normal rate and regular rhythm.     Pulses: Normal pulses.          Radial pulses are 2+ on the left side.     Arteriovenous access: right arteriovenous access is present.    Comments: Right radiocephalic AV fistula with good thrill and bruit Pulmonary:     Effort: Pulmonary effort is normal.     Breath sounds: Normal breath sounds.  Neurological:     Mental Status: He is alert and oriented to person, place, and time.  Psychiatric:         Mood and Affect: Mood normal.        Behavior: Behavior normal.        Thought Content: Thought content normal.        Judgment: Judgment normal.     BP (!) 162/81 (BP Location: Left Arm)   Pulse 73   Resp 20   Ht 5\' 11"  (1.803 m)   Wt 253 lb (114.8 kg)   BMI 35.29 kg/m   Past Medical History:  Diagnosis Date  . Chronic kidney disease    stage 4  . Colon cancer ([place]) 02-06-16   INVASIVE COLORECTAL ADENOCARCINOMA, MODERATELY DIFFERENTIATED  . Colon polyp 02-06-16   TUBULAR ADENOMA and INVASIVE COLORECTAL ADENOCARCINOMA, MODERATELY DIFFERENTIATED  . Coronary artery disease   . Diabetes mellitus without complication ([place])   . Hyperlipemia   . Hypertension   . Polycystic kidney disease   . Ventral hernia     Social History   Socioeconomic History  . Marital status: Married    Spouse name: Not on file  . Number of children: Not on file  . Years of education: Not on file  . Highest education level: Not on file  Occupational History  . Not on file  Tobacco Use  . Smoking status: Former Smoker    Packs/day: 0.25    Years: 2.00    Pack years: 0.50    Types: Cigarettes  Quit date: 02/28/1975    Years since quitting: 45.1  . Smokeless tobacco: Never Used  Vaping Use  . Vaping Use: Never used  Substance and Sexual Activity  . Alcohol use: Yes    Alcohol/week: 0.0 standard drinks    Comment: OCCASIONAL/ none in last year   . Drug use: No  . Sexual activity: Not on file  Other Topics Concern  . Not on file  Social History Narrative  . Not on file   Social Determinants of Health   Financial Resource Strain:   . Difficulty of Paying Living Expenses:   Food Insecurity:   . Worried About Charity fundraiser in the Last Year:   . Arboriculturist in the Last Year:   Transportation Needs:   . Film/video editor (Medical):   Marland Kitchen Lack of Transportation (Non-Medical):   Physical Activity:   . Days of Exercise per Week:   . Minutes of Exercise per Session:     Stress:   . Feeling of Stress :   Social Connections:   . Frequency of Communication with Friends and Family:   . Frequency of Social Gatherings with Friends and Family:   . Attends Religious Services:   . Active Member of Clubs or Organizations:   . Attends Archivist Meetings:   Marland Kitchen Marital Status:   Intimate Partner Violence:   . Fear of Current or Ex-Partner:   . Emotionally Abused:   Marland Kitchen Physically Abused:   . Sexually Abused:     Past Surgical History:  Procedure Laterality Date  . AV FISTULA PLACEMENT Right 09/12/2018   Procedure: ARTERIOVENOUS (AV) FISTULA CREATION ( RADIOCEPHALIC );  Surgeon: Katha Cabal, MD;  Location: [facility] ORS;  Service: Vascular;  Laterality: Right;  . COLONOSCOPY WITH PROPOFOL N/A 02/06/2016   Procedure: COLONOSCOPY WITH PROPOFOL;  Surgeon: Lollie Sails, MD;  Location: [facility] ENDOSCOPY;  Service: Endoscopy;  Laterality: N/A;  . COLONOSCOPY WITH PROPOFOL N/A 07/17/2017   Procedure: COLONOSCOPY WITH PROPOFOL;  Surgeon: Toledo, Benay Pike, MD;  Location: [facility] ENDOSCOPY;  Service: Gastroenterology;  Laterality: N/A;  . CORONARY ANGIOPLASTY    . CORONARY ARTERY BYPASS GRAFT  Jan 2010   [facility]  . FRACTURE SURGERY     left arm  . HERNIA REPAIR  1999   Dr Bary Castilla  umbilical  . MENISCUS REPAIR Right 2014  . RECTAL EUS  03/01/2016  . TONSILLECTOMY      Family History  Problem Relation Age of Onset  . Stroke Father   . Polycystic kidney disease Mother     No Known Allergies     Assessment & Plan:   1. End stage renal disease ([place]) Recommend:  The patient is doing well and currently has adequate dialysis access. The patient's dialysis center is not reporting any access issues. Flow pattern is stable when compared to the prior ultrasound.  The patient does have an area of stenosis seen on ultrasound today however that he notes that his dialysis access has been performing better than it has been in months.  Because of this we will  continue with conservative management by close follow-up.  The patient should have a duplex ultrasound of the dialysis access in 6 months. The patient will follow-up with me in the office after each ultrasound     2. Uncontrolled type 2 diabetes mellitus with hyperglycemia ([place]) Continue hypoglycemic medications as already ordered, these medications have been reviewed and there are no changes at this time.  Hgb A1C to be monitored as already arranged by primary service   3. Essential hypertension Continue antihypertensive medications as already ordered, these medications have been reviewed and there are no changes at this time.    Current Outpatient Medications on File Prior to Visit  Medication Sig Dispense Refill  . aspirin 81 MG tablet Take 81 mg by mouth daily.    . calcitRIOL (ROCALTROL) 0.25 MCG capsule TAKE 1 CAPSULE(0.25 MCG) BY MOUTH DAILY    . carvedilol (COREG) 25 MG tablet Take 25 mg by mouth 2 (two) times daily with a meal.    . Cinacalcet HCl (SENSIPAR PO) Take by mouth.    . clopidogrel (PLAVIX) 75 MG tablet Take 75 mg by mouth daily.    . furosemide (LASIX) 20 MG tablet Take 20 mg by mouth daily.     . hydrALAZINE (APRESOLINE) 25 MG tablet     . Insulin Glargine (BASAGLAR KWIKPEN) 100 UNIT/ML SOPN Inject 32 Units into the skin daily.    . insulin lispro (HUMALOG KWIKPEN) 100 UNIT/ML KwikPen Inject 10 Units into the skin daily before supper.     . isosorbide mononitrate (IMDUR) 120 MG 24 hr tablet Take 120 mg by mouth daily.    Marland Kitchen lidocaine-prilocaine (EMLA) cream Apply to access before dialysis    . Methoxy PEG-Epoetin Beta (MIRCERA IJ) Mircera    . ramipril (ALTACE) 10 MG capsule Take 10 mg by mouth 2 (two) times daily.    . sevelamer (RENAGEL) 800 MG tablet Take 800 mg by mouth 3 (three) times daily with meals.   11  . simvastatin (ZOCOR) 20 MG tablet Take 20 mg by mouth daily.    . tamsulosin (FLOMAX) 0.4 MG CAPS capsule Take 0.4 mg by mouth 2 (two) times daily.      Current Facility-Administered Medications on File Prior to Visit  Medication Dose Route Frequency Provider Last Rate Last Admin  . vancomycin (VANCOCIN) 1,000 mg in sodium chloride 0.9 % 500 mL IVPB  1,000 mg Intravenous Once Kris Hartmann, NP        There are no Patient Instructions on file for this visit. No follow-ups on file.   Kris Hartmann, NP

## 2020-06-29 ENCOUNTER — Other Ambulatory Visit (INDEPENDENT_AMBULATORY_CARE_PROVIDER_SITE_OTHER): Payer: Self-pay | Admitting: Nurse Practitioner

## 2020-06-29 DIAGNOSIS — N186 End stage renal disease: Secondary | ICD-10-CM

## 2020-07-06 ENCOUNTER — Encounter (INDEPENDENT_AMBULATORY_CARE_PROVIDER_SITE_OTHER): Payer: Self-pay | Admitting: Nurse Practitioner

## 2020-07-06 ENCOUNTER — Other Ambulatory Visit: Payer: Self-pay

## 2020-07-06 ENCOUNTER — Ambulatory Visit (INDEPENDENT_AMBULATORY_CARE_PROVIDER_SITE_OTHER): Payer: Medicare Other

## 2020-07-06 ENCOUNTER — Ambulatory Visit (INDEPENDENT_AMBULATORY_CARE_PROVIDER_SITE_OTHER): Payer: Medicare Other | Admitting: Nurse Practitioner

## 2020-07-06 VITALS — BP 144/77 | HR 83 | Resp 16 | Wt 253.8 lb

## 2020-07-06 DIAGNOSIS — I25118 Atherosclerotic heart disease of native coronary artery with other forms of angina pectoris: Secondary | ICD-10-CM

## 2020-07-06 DIAGNOSIS — E1169 Type 2 diabetes mellitus with other specified complication: Secondary | ICD-10-CM | POA: Diagnosis not present

## 2020-07-06 DIAGNOSIS — E669 Obesity, unspecified: Secondary | ICD-10-CM | POA: Diagnosis not present

## 2020-07-06 DIAGNOSIS — N186 End stage renal disease: Secondary | ICD-10-CM

## 2020-07-06 DIAGNOSIS — I10 Essential (primary) hypertension: Secondary | ICD-10-CM

## 2020-07-06 NOTE — Progress Notes (Signed)
Subjective:    Patient ID: Tyrone Campbell, male    DOB: [birth year], 69 y.o.   MRN: [id number] Chief Complaint  Patient presents with  . Follow-up    ultrasound follow up    The patient returns to the office for followup of their dialysis access. The function of the access has been stable. The patient denies increased bleeding time or increased recirculation. Patient denies difficulty with cannulation. The patient denies hand pain or other symptoms consistent with steal phenomena.  No significant arm swelling.  The only issues with his access is that following dialysis there is a slightly reddened area around the proximal portion of his fistula that typically resolves within him leaving the dialysis center after about 5 minutes or so.  The patient denies  swelling at the access site. The patient denies fever or chills at home or while on dialysis.  The patient denies amaurosis fugax or recent TIA symptoms. There are no recent neurological changes noted. The patient denies claudication symptoms or rest pain symptoms. The patient denies history of DVT, PE or superficial thrombophlebitis. The patient denies recent episodes of angina or shortness of breath.    Today the patient has a flow volume of 890 this is slightly decreased from his previous flow volume of 1076.  The patient does have a narrowing in the mid forearm that was present at his last follow-up, however the velocities have actually decreased and improved since his previous office visit.     Review of Systems  Hematological: Bruises/bleeds easily.  All other systems reviewed and are negative.      Objective:   Physical Exam Vitals reviewed.  HENT:     Head: Normocephalic.  Cardiovascular:     Rate and Rhythm: Normal rate and regular rhythm.     Pulses: Normal pulses.     Arteriovenous access: right arteriovenous access is present.    Comments: Good thrill and bruit in right radiocephalic AV fistula Pulmonary:      Effort: Pulmonary effort is normal.  Abdominal:     Hernia: A hernia is present.  Neurological:     Mental Status: He is alert and oriented to person, place, and time.     Gait: Gait abnormal.  Psychiatric:        Mood and Affect: Mood normal.        Behavior: Behavior normal.        Thought Content: Thought content normal.        Judgment: Judgment normal.     BP (!) 144/77 (BP Location: Left Arm)   Pulse 83   Resp 16   Wt 253 lb 12.8 oz (115.1 kg)   BMI 35.40 kg/m   Past Medical History:  Diagnosis Date  . Chronic kidney disease    stage 4  . Colon cancer ([place]) 02-06-16   INVASIVE COLORECTAL ADENOCARCINOMA, MODERATELY DIFFERENTIATED  . Colon polyp 02-06-16   TUBULAR ADENOMA and INVASIVE COLORECTAL ADENOCARCINOMA, MODERATELY DIFFERENTIATED  . Coronary artery disease   . Diabetes mellitus without complication ([place])   . Hyperlipemia   . Hypertension   . Polycystic kidney disease   . Ventral hernia     Social History   Socioeconomic History  . Marital status: Married    Spouse name: Not on file  . Number of children: Not on file  . Years of education: Not on file  . Highest education level: Not on file  Occupational History  . Not on file  Tobacco Use  .  Smoking status: Former Smoker    Packs/day: 0.25    Years: 2.00    Pack years: 0.50    Types: Cigarettes    Quit date: 02/28/1975    Years since quitting: 45.3  . Smokeless tobacco: Never Used  Vaping Use  . Vaping Use: Never used  Substance and Sexual Activity  . Alcohol use: Yes    Alcohol/week: 0.0 standard drinks    Comment: OCCASIONAL/ none in last year   . Drug use: No  . Sexual activity: Not on file  Other Topics Concern  . Not on file  Social History Narrative  . Not on file   Social Determinants of Health   Financial Resource Strain:   . Difficulty of Paying Living Expenses: Not on file  Food Insecurity:   . Worried About Charity fundraiser in the Last Year: Not on file  . Ran Out of Food  in the Last Year: Not on file  Transportation Needs:   . Lack of Transportation (Medical): Not on file  . Lack of Transportation (Non-Medical): Not on file  Physical Activity:   . Days of Exercise per Week: Not on file  . Minutes of Exercise per Session: Not on file  Stress:   . Feeling of Stress : Not on file  Social Connections:   . Frequency of Communication with Friends and Family: Not on file  . Frequency of Social Gatherings with Friends and Family: Not on file  . Attends Religious Services: Not on file  . Active Member of Clubs or Organizations: Not on file  . Attends Archivist Meetings: Not on file  . Marital Status: Not on file  Intimate Partner Violence:   . Fear of Current or Ex-Partner: Not on file  . Emotionally Abused: Not on file  . Physically Abused: Not on file  . Sexually Abused: Not on file    Past Surgical History:  Procedure Laterality Date  . AV FISTULA PLACEMENT Right 09/12/2018   Procedure: ARTERIOVENOUS (AV) FISTULA CREATION ( RADIOCEPHALIC );  Surgeon: Katha Cabal, MD;  Location: [facility] ORS;  Service: Vascular;  Laterality: Right;  . COLONOSCOPY WITH PROPOFOL N/A 02/06/2016   Procedure: COLONOSCOPY WITH PROPOFOL;  Surgeon: Lollie Sails, MD;  Location: [facility] ENDOSCOPY;  Service: Endoscopy;  Laterality: N/A;  . COLONOSCOPY WITH PROPOFOL N/A 07/17/2017   Procedure: COLONOSCOPY WITH PROPOFOL;  Surgeon: Toledo, Benay Pike, MD;  Location: [facility] ENDOSCOPY;  Service: Gastroenterology;  Laterality: N/A;  . CORONARY ANGIOPLASTY    . CORONARY ARTERY BYPASS GRAFT  Jan 2010   [facility]  . FRACTURE SURGERY     left arm  . HERNIA REPAIR  1999   Dr Bary Castilla  umbilical  . MENISCUS REPAIR Right 2014  . RECTAL EUS  03/01/2016  . TONSILLECTOMY      Family History  Problem Relation Age of Onset  . Stroke Father   . Polycystic kidney disease Mother     No Known Allergies     Assessment & Plan:   1. ESRD (end stage renal disease)  ([place]) Recommend:  The patient is doing well and currently has adequate dialysis access. The patient's dialysis center is not reporting any access issues. Flow pattern is stable when compared to the prior ultrasound.  The patient should have a duplex ultrasound of the dialysis access in 6 months. The patient will follow-up with me in the office after each ultrasound     2. Essential hypertension Continue antihypertensive medications as already  ordered, these medications have been reviewed and there are no changes at this time.   3. Diabetes mellitus type 2 in obese [facility]) Continue hypoglycemic medications as already ordered, these medications have been reviewed and there are no changes at this time.  Hgb A1C to be monitored as already arranged by primary service    Current Outpatient Medications on File Prior to Visit  Medication Sig Dispense Refill  . aspirin 81 MG tablet Take 81 mg by mouth daily.    . clopidogrel (PLAVIX) 75 MG tablet Take 75 mg by mouth daily.    . furosemide (LASIX) 20 MG tablet Take 20 mg by mouth daily.     . Insulin Glargine (BASAGLAR KWIKPEN) 100 UNIT/ML SOPN Inject 32 Units into the skin daily.    . insulin lispro (HUMALOG KWIKPEN) 100 UNIT/ML KwikPen Inject 10 Units into the skin daily before supper.     . lidocaine-prilocaine (EMLA) cream Apply to access before dialysis    . sevelamer (RENAGEL) 800 MG tablet Take 800 mg by mouth 3 (three) times daily with meals.   11  . simvastatin (ZOCOR) 20 MG tablet Take 20 mg by mouth daily.    . tamsulosin (FLOMAX) 0.4 MG CAPS capsule Take 0.4 mg by mouth 2 (two) times daily.    . calcitRIOL (ROCALTROL) 0.25 MCG capsule TAKE 1 CAPSULE(0.25 MCG) BY MOUTH DAILY (Patient not taking: Reported on 07/06/2020)    . carvedilol (COREG) 25 MG tablet Take 25 mg by mouth 2 (two) times daily with a meal. (Patient not taking: Reported on 07/06/2020)    . Cinacalcet HCl (SENSIPAR PO) Take by mouth. (Patient not taking: Reported on  07/06/2020)    . hydrALAZINE (APRESOLINE) 25 MG tablet  (Patient not taking: Reported on 07/06/2020)    . isosorbide mononitrate (IMDUR) 120 MG 24 hr tablet Take 120 mg by mouth daily. (Patient not taking: Reported on 07/06/2020)    . Methoxy PEG-Epoetin Beta (MIRCERA IJ) Mircera (Patient not taking: Reported on 07/06/2020)    . ramipril (ALTACE) 10 MG capsule Take 10 mg by mouth 2 (two) times daily. (Patient not taking: Reported on 07/06/2020)     Current Facility-Administered Medications on File Prior to Visit  Medication Dose Route Frequency Provider Last Rate Last Admin  . vancomycin (VANCOCIN) 1,000 mg in sodium chloride 0.9 % 500 mL IVPB  1,000 mg Intravenous Once Kris Hartmann, NP        There are no Patient Instructions on file for this visit. No follow-ups on file.   Kris Hartmann, NP

## 2020-09-12 ENCOUNTER — Encounter (INDEPENDENT_AMBULATORY_CARE_PROVIDER_SITE_OTHER): Payer: Medicare Other

## 2020-09-12 ENCOUNTER — Ambulatory Visit (INDEPENDENT_AMBULATORY_CARE_PROVIDER_SITE_OTHER): Payer: Medicare Other | Admitting: Vascular Surgery

## 2020-12-01 ENCOUNTER — Encounter (INDEPENDENT_AMBULATORY_CARE_PROVIDER_SITE_OTHER): Payer: Self-pay

## 2020-12-09 ENCOUNTER — Other Ambulatory Visit
Admission: RE | Admit: 2020-12-09 | Discharge: 2020-12-09 | Disposition: A | Discharge status: Home / Self Care | Payer: Medicare Other | Source: Ambulatory Visit | Attending: Vascular Surgery | Admitting: Vascular Surgery

## 2020-12-09 ENCOUNTER — Other Ambulatory Visit: Payer: Self-pay

## 2020-12-09 DIAGNOSIS — Z01812 Encounter for preprocedural laboratory examination: Secondary | ICD-10-CM | POA: Diagnosis present

## 2020-12-09 DIAGNOSIS — Z20822 Contact with and (suspected) exposure to covid-19: Secondary | ICD-10-CM | POA: Insufficient documentation

## 2020-12-10 LAB — SARS CORONAVIRUS 2 (TAT 6-24 HRS): SARS Coronavirus 2: NEGATIVE

## 2020-12-12 ENCOUNTER — Other Ambulatory Visit: Payer: Medicare Other

## 2020-12-13 ENCOUNTER — Ambulatory Visit
Admission: RE | Admit: 2020-12-13 | Discharge: 2020-12-13 | Disposition: A | Discharge status: Home / Self Care | Payer: Medicare Other | Attending: Vascular Surgery | Admitting: Vascular Surgery

## 2020-12-13 ENCOUNTER — Other Ambulatory Visit: Payer: Self-pay

## 2020-12-13 ENCOUNTER — Encounter
Admission: RE | Disposition: A | Discharge status: Home / Self Care | Payer: Self-pay | Source: Home / Self Care | Attending: Vascular Surgery

## 2020-12-13 ENCOUNTER — Other Ambulatory Visit (INDEPENDENT_AMBULATORY_CARE_PROVIDER_SITE_OTHER): Payer: Self-pay | Admitting: Nurse Practitioner

## 2020-12-13 DIAGNOSIS — T82898A Other specified complication of vascular prosthetic devices, implants and grafts, initial encounter: Secondary | ICD-10-CM | POA: Diagnosis not present

## 2020-12-13 DIAGNOSIS — I120 Hypertensive chronic kidney disease with stage 5 chronic kidney disease or end stage renal disease: Secondary | ICD-10-CM | POA: Insufficient documentation

## 2020-12-13 DIAGNOSIS — N186 End stage renal disease: Secondary | ICD-10-CM | POA: Diagnosis not present

## 2020-12-13 DIAGNOSIS — Z87891 Personal history of nicotine dependence: Secondary | ICD-10-CM | POA: Diagnosis not present

## 2020-12-13 DIAGNOSIS — Y841 Kidney dialysis as the cause of abnormal reaction of the patient, or of later complication, without mention of misadventure at the time of the procedure: Secondary | ICD-10-CM | POA: Diagnosis not present

## 2020-12-13 DIAGNOSIS — T82858A Stenosis of vascular prosthetic devices, implants and grafts, initial encounter: Secondary | ICD-10-CM | POA: Insufficient documentation

## 2020-12-13 DIAGNOSIS — E1122 Type 2 diabetes mellitus with diabetic chronic kidney disease: Secondary | ICD-10-CM | POA: Diagnosis not present

## 2020-12-13 DIAGNOSIS — Z992 Dependence on renal dialysis: Secondary | ICD-10-CM | POA: Diagnosis not present

## 2020-12-13 HISTORY — PX: A/V FISTULAGRAM: CATH118298

## 2020-12-13 LAB — GLUCOSE, CAPILLARY: Glucose-Capillary: 175 mg/dL — ABNORMAL HIGH (ref 70–99)

## 2020-12-13 LAB — POTASSIUM (ARMC VASCULAR LAB ONLY): Potassium (ARMC vascular lab): 3.7 (ref 3.5–5.1)

## 2020-12-13 SURGERY — A/V FISTULAGRAM
Anesthesia: Moderate Sedation | Laterality: Right

## 2020-12-13 MED ORDER — HYDROMORPHONE HCL 1 MG/ML IJ SOLN
1.0000 mg | Freq: Once | INTRAMUSCULAR | Status: DC | PRN
Start: 2020-12-13 — End: 2020-12-14

## 2020-12-13 MED ORDER — FENTANYL CITRATE (PF) 100 MCG/2ML IJ SOLN
INTRAMUSCULAR | Status: DC | PRN
  Administered 2020-12-13: 25 ug via INTRAVENOUS
  Administered 2020-12-13 (×2): 50 ug via INTRAVENOUS

## 2020-12-13 MED ORDER — MIDAZOLAM HCL 5 MG/5ML IJ SOLN
INTRAMUSCULAR | Status: AC
  Filled 2020-12-13: qty 5

## 2020-12-13 MED ORDER — FAMOTIDINE 20 MG PO TABS: 40.0000 mg | ORAL_TABLET | Freq: Once | ORAL | Status: DC | PRN

## 2020-12-13 MED ORDER — ONDANSETRON HCL 4 MG/2ML IJ SOLN: 4.0000 mg | Freq: Four times a day (QID) | INTRAMUSCULAR | Status: DC | PRN

## 2020-12-13 MED ORDER — IODIXANOL 320 MG/ML IV SOLN
INTRAVENOUS | Status: DC | PRN
  Administered 2020-12-13: 35 mL via INTRA_ARTERIAL

## 2020-12-13 MED ORDER — DIPHENHYDRAMINE HCL 50 MG/ML IJ SOLN: 50.0000 mg | Freq: Once | INTRAMUSCULAR | Status: DC | PRN

## 2020-12-13 MED ORDER — METHYLPREDNISOLONE SODIUM SUCC 125 MG IJ SOLR: 125.0000 mg | Freq: Once | INTRAMUSCULAR | Status: DC | PRN

## 2020-12-13 MED ORDER — SODIUM CHLORIDE 0.9 % IV SOLN
INTRAVENOUS | Status: DC
  Administered 2020-12-13: 10 mL via INTRAVENOUS

## 2020-12-13 MED ORDER — CEFAZOLIN SODIUM-DEXTROSE 1-4 GM/50ML-% IV SOLN
INTRAVENOUS | Status: AC
  Administered 2020-12-13: 1 g via INTRAVENOUS
  Filled 2020-12-13: qty 50

## 2020-12-13 MED ORDER — CEFAZOLIN SODIUM-DEXTROSE 1-4 GM/50ML-% IV SOLN: 1.0000 g | Freq: Once | INTRAVENOUS | Status: AC

## 2020-12-13 MED ORDER — HEPARIN SODIUM (PORCINE) 1000 UNIT/ML IJ SOLN
INTRAMUSCULAR | Status: DC | PRN
  Administered 2020-12-13: 4000 [IU] via INTRAVENOUS

## 2020-12-13 MED ORDER — FENTANYL CITRATE (PF) 100 MCG/2ML IJ SOLN
INTRAMUSCULAR | Status: AC
  Filled 2020-12-13: qty 2

## 2020-12-13 MED ORDER — MIDAZOLAM HCL 2 MG/ML PO SYRP: 8.0000 mg | ORAL_SOLUTION | Freq: Once | ORAL | Status: DC | PRN

## 2020-12-13 MED ORDER — MIDAZOLAM HCL 2 MG/2ML IJ SOLN
INTRAMUSCULAR | Status: DC | PRN
  Administered 2020-12-13: 1 mg via INTRAVENOUS
  Administered 2020-12-13: 2 mg via INTRAVENOUS

## 2020-12-13 SURGICAL SUPPLY — 18 items
BALLN DORADO 3X40X80 (BALLOONS) ×2
BALLN DORADO 5X40X80 (BALLOONS) ×2
BALLN ULTRV 018 6X40X75 (BALLOONS) ×2
BALLOON DORADO 3X40X80 (BALLOONS) ×1 IMPLANT
BALLOON DORADO 5X40X80 (BALLOONS) ×1 IMPLANT
BALLOON ULTRV 018 6X40X75 (BALLOONS) ×1 IMPLANT
CANNULA 5F STIFF (CANNULA) ×2 IMPLANT
CATH BEACON 5 .035 65 KMP TIP (CATHETERS) ×2 IMPLANT
COVER EZ STRL 42X30 (DRAPES) ×2 IMPLANT
COVER PROBE U/S 5X48 (MISCELLANEOUS) ×2 IMPLANT
DRAPE BRACHIAL (DRAPES) ×2 IMPLANT
KIT ENCORE 26 ADVANTAGE (KITS) ×2 IMPLANT
PACK ANGIOGRAPHY (CUSTOM PROCEDURE TRAY) ×2 IMPLANT
SHEATH BRITE TIP 6FRX5.5 (SHEATH) ×2 IMPLANT
STENT VIABAHN 6X50X120 (Permanent Stent) ×2 IMPLANT
SUT MNCRL AB 4-0 PS2 18 (SUTURE) ×2 IMPLANT
TOWEL OR 17X26 4PK STRL BLUE (TOWEL DISPOSABLE) ×2 IMPLANT
WIRE G 018X200 V18 (WIRE) ×2 IMPLANT

## 2020-12-13 NOTE — Discharge Instructions (Signed)
Dialysis Fistulogram, Care After The following information offers guidance on how to care for yourself after your procedure. Your health care provider may also give you more specific instructions. If you have problems or questions, contact your health care provider. What can I expect after the procedure? After the procedure, it is common to have:  A small amount of discomfort in the area where the small tube (catheter) was placed for the procedure.  A small amount of bruising around the fistula.  Sleepiness and tiredness (fatigue). Follow these instructions at home: Puncture site care  Follow instructions from your health care provider about how to take care of the site where catheters were inserted. Make sure you: ? Wash your hands with soap and water for at least 20 seconds before and after you change your bandage (dressing). If soap and water are not available, use hand sanitizer. ? Change your dressing as told by your health care provider. ? Leave stitches (sutures), skin glue, or adhesive strips in place. These skin closures may need to stay in place for 2 weeks or longer. If adhesive strip edges start to loosen and curl up, you may trim the loose edges. Do not remove adhesive strips completely unless your health care provider tells you to do that.  Check your puncture area every day for signs of infection. Check for: ? More redness, swelling, or pain. ? Fluid or blood. ? Warmth. ? Pus or a bad smell.   Activity  Rest as much as you can.  If you were given a sedative during the procedure, it can affect you for several hours. Do not drive or operate machinery until your health care provider says that it is safe.  Do not lift anything that is heavier than 5 lb (2.3 kg), or the limit that you are told, on the day of your procedure.  Do not do anything strenuous with your arm for the rest of the day. Avoid household activities, such as vacuuming.  Return to your normal activities as  told by your health care provider. Ask your health care provider what activities are safe for you. Safety To prevent damage to your graft or fistula:  Do not wear tight-fitting clothing or jewelry on the arm or leg that has your graft or fistula.  Tell all your health care providers that you have a dialysis fistula or graft.  Do not allow blood draws, IVs, or blood pressure readings to be done in the arm that has your fistula or graft.  Do not allow flu shots or vaccinations in the arm with your fistula or graft. General instructions  Take over-the-counter and prescription medicines only as told by your health care provider.  Do not take baths, swim, or use a hot tub until your health care provider approves. Ask your health care provider if you may take showers. You may only be allowed to take sponge baths.  Monitor your dialysis fistula closely. Check to make sure that you can feel a vibration or buzz (a thrill) when you put your fingers over the fistula.  Keep all follow-up visits. This is important. Contact a health care provider if:  You have more redness, swelling, or pain at the site where the catheter was put in.  You have fluid or blood coming from the catheter site.  You have pus or a bad smell coming from the catheter site.  Your catheter site feels warm.  You have a fever or chills. Get help right away if:    You have bleeding from the vascular access site that does not stop.  You feel weak.  You have trouble balancing.  You have trouble moving your arms or legs.  You have problems with your speech or vision.  You can no longer feel a vibration or buzz when you put your fingers over your fistula.  The limb that was used for the procedure swells or becomes painful, cold, blue, or pale white.  You have chest pain or shortness of breath. These symptoms may represent a serious problem that is an emergency. Do not wait to see if the symptoms will go away. Get  medical help right away. Call your local emergency services (911 in the [place] Do not drive yourself to the hospital. Summary  After a dialysis fistulogram, it is common to have a small amount of discomfort or bruising in the area where the small, thin tube (catheter) was placed.  Rest as much as you can after your procedure. Return to your normal activities as told by your health care provider.  Take over-the-counter and prescription medicines only as told by your health care provider.  Follow instructions from your health care provider about how to take care of the site where the catheter was inserted.  Keep all follow-up visits. This is important. This information is not intended to replace advice given to you by your health care provider. Make sure you discuss any questions you have with your health care provider. Document Revised: 04/27/2020 Document Reviewed: 04/27/2020 Elsevier Patient Education  2021 [employer]  

## 2020-12-13 NOTE — Interval H&P Note (Signed)
History and Physical Interval Note:  12/13/2020 3:49 PM  Tyrone Campbell  has presented today for surgery, with the diagnosis of RT Arm Fistulagram   ESRD   Pt to have Covid test on 3-14.  The various methods of treatment have been discussed with the patient and family. After consideration of risks, benefits and other options for treatment, the patient has consented to  Procedure(s): A/V FISTULAGRAM (Right) as a surgical intervention.  The patient's history has been reviewed, patient examined, no change in status, stable for surgery.  I have reviewed the patient's chart and labs.  Questions were answered to the patient's satisfaction.     Hortencia Pilar

## 2020-12-13 NOTE — H&P (Signed)
[facility] Admission History & Physical  MRN : [id number]  Tyrone Campbell is a 70 y.o. (1951/01/22) male who presents with chief complaint of scheduled fistulogram.  History of Present Illness: I am asked to evaluate the patient by the dialysis center. The patient was sent here because they were unable to adequate dialysis during his last session. Furthermore the Center states there is a weak thrill or bruit. The patient states this is the first dialysis run to be missed. This problem is acute in onset and has been present for approximately 2 days. The patient is unaware of any other change.  Patient denies pain or tenderness overlying the access.  There is no pain with dialysis.  The patient denies hand pain or finger pain consistent with steal syndrome.   Current Facility-Administered Medications  Medication Dose Route Frequency Provider Last Rate Last Admin  . 0.9 %  sodium chloride infusion   Intravenous Continuous Eulogio Ditch E, NP      . ceFAZolin (ANCEF) 1-4 GM/50ML-% IVPB           . ceFAZolin (ANCEF) IVPB 1 g/50 mL premix  1 g Intravenous Once Kris Hartmann, NP      . diphenhydrAMINE (BENADRYL) injection 50 mg  50 mg Intravenous Once PRN Kris Hartmann, NP      . famotidine (PEPCID) tablet 40 mg  40 mg Oral Once PRN Kris Hartmann, NP      . HYDROmorphone (DILAUDID) injection 1 mg  1 mg Intravenous Once PRN Eulogio Ditch E, NP      . methylPREDNISolone sodium succinate (SOLU-MEDROL) 125 mg/2 mL injection 125 mg  125 mg Intravenous Once PRN Eulogio Ditch E, NP      . midazolam (VERSED) 2 MG/ML syrup 8 mg  8 mg Oral Once PRN Kris Hartmann, NP      . ondansetron (ZOFRAN) injection 4 mg  4 mg Intravenous Q6H PRN Kris Hartmann, NP       Past Medical History:  Diagnosis Date  . Chronic kidney disease    stage 4  . Colon cancer ([place]) 02-06-16   INVASIVE COLORECTAL ADENOCARCINOMA, MODERATELY DIFFERENTIATED  . Colon polyp 02-06-16   TUBULAR ADENOMA  and INVASIVE COLORECTAL ADENOCARCINOMA, MODERATELY DIFFERENTIATED  . Coronary artery disease   . Diabetes mellitus without complication ([place])   . Hyperlipemia   . Hypertension   . Polycystic kidney disease   . Ventral hernia    Past Surgical History:  Procedure Laterality Date  . AV FISTULA PLACEMENT Right 09/12/2018   Procedure: ARTERIOVENOUS (AV) FISTULA CREATION ( RADIOCEPHALIC );  Surgeon: Katha Cabal, MD;  Location: [facility] ORS;  Service: Vascular;  Laterality: Right;  . COLONOSCOPY WITH PROPOFOL N/A 02/06/2016   Procedure: COLONOSCOPY WITH PROPOFOL;  Surgeon: Lollie Sails, MD;  Location: [facility] ENDOSCOPY;  Service: Endoscopy;  Laterality: N/A;  . COLONOSCOPY WITH PROPOFOL N/A 07/17/2017   Procedure: COLONOSCOPY WITH PROPOFOL;  Surgeon: Toledo, Benay Pike, MD;  Location: [facility] ENDOSCOPY;  Service: Gastroenterology;  Laterality: N/A;  . CORONARY ANGIOPLASTY    . CORONARY ARTERY BYPASS GRAFT  Jan 2010   [facility]  . FRACTURE SURGERY     left arm  . HERNIA REPAIR  1999   Dr Bary Castilla  umbilical  . MENISCUS REPAIR Right 2014  . RECTAL EUS  03/01/2016  . TONSILLECTOMY     Social History Social History   Tobacco Use  . Smoking status: Former Smoker    Packs/day:  0.25    Years: 2.00    Pack years: 0.50    Types: Cigarettes    Quit date: 02/28/1975    Years since quitting: 45.8  . Smokeless tobacco: Never Used  Vaping Use  . Vaping Use: Never used  Substance Use Topics  . Alcohol use: Yes    Alcohol/week: 0.0 standard drinks    Comment: OCCASIONAL/ none in last year   . Drug use: No   Family History Family History  Problem Relation Age of Onset  . Stroke Father   . Polycystic kidney disease Mother   No family history of bleeding or clotting disorders, autoimmune disease or porphyria.  No Known Allergies  REVIEW OF SYSTEMS (Negative unless checked)  Constitutional: [] Weight loss  [] Fever  [] Chills Cardiac: [] Chest pain   [] Chest pressure   [] Palpitations    [] Shortness of breath when laying flat   [] Shortness of breath at rest   [x] Shortness of breath with exertion. Vascular:  [] Pain in legs with walking   [] Pain in legs at rest   [] Pain in legs when laying flat   [] Claudication   [] Pain in feet when walking  [] Pain in feet at rest  [] Pain in feet when laying flat   [] History of DVT   [] Phlebitis   [] Swelling in legs   [] Varicose veins   [] Non-healing ulcers Pulmonary:   [] Uses home oxygen   [] Productive cough   [] Hemoptysis   [] Wheeze  [] COPD   [] Asthma Neurologic:  [] Dizziness  [] Blackouts   [] Seizures   [] History of stroke   [] History of TIA  [] Aphasia   [] Temporary blindness   [] Dysphagia   [] Weakness or numbness in arms   [] Weakness or numbness in legs Musculoskeletal:  [] Arthritis   [] Joint swelling   [] Joint pain   [] Low back pain Hematologic:  [] Easy bruising  [] Easy bleeding   [] Hypercoagulable state   [x] Anemic  [] Hepatitis Gastrointestinal:  [] Blood in stool   [] Vomiting blood  [] Gastroesophageal reflux/heartburn   [] Difficulty swallowing. Genitourinary:  [x] Chronic kidney disease   [] Difficult urination  [] Frequent urination  [] Burning with urination   [] Blood in urine Skin:  [] Rashes   [] Ulcers   [] Wounds Psychological:  [] History of anxiety   []  History of major depression.  Physical Examination  There were no vitals filed for this visit. There is no height or weight on file to calculate BMI.  Gen: WD/WN, NAD Head: /AT, No temporalis wasting. Prominent temp pulse not noted. Ear/Nose/Throat: Hearing grossly intact, nares w/o erythema or drainage, oropharynx w/o Erythema/Exudate,  Eyes: Conjunctiva clear, sclera non-icteric Neck: Trachea midline.  No JVD.  Pulmonary:  Good air movement, respirations not labored, no use of accessory muscles.  Cardiac: RRR, normal S1, S2. Vascular:  Vessel Right Left  Radial Palpable Palpable  Ulnar Not Palpable Not Palpable  Brachial Palpable Palpable  Carotid Palpable, without bruit Palpable,  without bruit   Right upper extremity:  Dialysis Access with weak bruit or thrill  Gastrointestinal: soft, non-tender/non-distended. No guarding/reflex.  Musculoskeletal: M/S 5/5 throughout.  Extremities without ischemic changes.  No deformity or atrophy.  Neurologic: Sensation grossly intact in extremities.  Symmetrical.  Speech is fluent. Motor exam as listed above. Psychiatric: Judgment intact, Mood & affect appropriate for pt's clinical situation. Dermatologic: No rashes or ulcers noted.  No cellulitis or open wounds. Lymph : No Cervical, Axillary, or Inguinal lymphadenopathy.  CBC Lab Results  Component Value Date   WBC 5.8 09/05/2018   HGB 10.6 (L) 09/05/2018   HCT 31.9 (L) 09/05/2018  MCV 87.4 09/05/2018   PLT 169 09/05/2018   BMET    Component Value Date/Time   NA 139 09/05/2018 1510   K 4.4 09/05/2018 1510   CL 106 09/05/2018 1510   CO2 23 09/05/2018 1510   GLUCOSE 116 (H) 09/05/2018 1510   BUN 97 (H) 09/05/2018 1510   CREATININE 8.61 (H) 09/05/2018 1510   CALCIUM 8.5 (L) 09/05/2018 1510   GFRNONAA 6 (L) 09/05/2018 1510   GFRAA 7 (L) 09/05/2018 1510   CrCl cannot be calculated (Patient's most recent lab result is older than the maximum 21 days allowed.).  COAG Lab Results  Component Value Date   INR 1.15 09/05/2018   Radiology No results found.  Assessment/Plan 1.  Complication dialysis device with thrombosis AV access:  Patient's right upper extremity dialysis access is possibly thrombosed. The patient will undergo thrombectomy using interventional techniques. The risks and benefits were described to the patient. All questions were answered. The patient agrees to proceed with angiography and intervention. Potassium will be drawn to ensure that it is an appropriate level prior to performing thrombectomy.  2.  End-stage renal disease requiring hemodialysis:  Patient will continue dialysis therapy without further interruption if a successful thrombectomy is not  achieved then catheter will be placed. Dialysis has already been arranged since the patient missed their previous session 3.  Hypertension:  Patient will continue medical management; nephrology is following no changes in oral medications. 4. Diabetes mellitus:  Glucose will be monitored and oral medications been held this morning once the patient has undergone the patient's procedure po intake will be reinitiated and again Accu-Cheks will be used to assess the blood glucose level and treat as needed. The patient will be restarted on the patient's usual hypoglycemic regime  Discussed with Dr. Francene Castle, PA-C  12/13/2020 1:45 PM

## 2020-12-13 NOTE — Op Note (Signed)
OPERATIVE NOTE   PROCEDURE: 1. Contrast injection right arm radiocephalic AV access 2. Percutaneous transluminal angioplasty and stent placement cephalic vein mid forearm  PRE-OPERATIVE DIAGNOSIS: Complication of dialysis access                                                       End Stage Renal Disease  POST-OPERATIVE DIAGNOSIS: same as above   SURGEON: Katha Cabal, M.D.  ANESTHESIA: Conscious sedation was administered under my direct supervision by the interventional radiology RN. IV Versed plus fentanyl were utilized. Continuous ECG, pulse oximetry and blood pressure was monitored throughout the entire procedure.  Conscious sedation was for a total of 44 minutes and 42 seconds.  ESTIMATED BLOOD LOSS: minimal  FINDING(S): 1. String sign located in the cephalic vein in the mid forearm  SPECIMEN(S):  None  CONTRAST: 35 cc  FLUOROSCOPY TIME: 4.0 minutes  INDICATIONS: Tyrone Campbell is a 70 y.o. male who  presents with malfunctioning right arm AV access.  The patient is scheduled for angiography with possible intervention of the AV access.  The patient is aware the risks include but are not limited to: bleeding, infection, thrombosis of the cannulated access, and possible anaphylactic reaction to the contrast.  The patient acknowledges if the access can not be salvaged a tunneled catheter will be needed and will be placed during this procedure.  The patient is aware of the risks of the procedure and elects to proceed with the angiogram and intervention.  DESCRIPTION: After full informed written consent was obtained, the patient was brought back to the Special Procedure suite and placed supine position.  Appropriate cardiopulmonary monitors were placed.  The right arm was prepped and draped in the standard fashion.  Appropriate timeout is called. The right radiocephalic fistula was cannulated with a micropuncture needle.  Cannulation was performed with ultrasound guidance.  Ultrasound was placed in a sterile sleeve, the AV access was interrogated and noted to be echolucent and compressible indicating patency. Image was recorded for the permanent record. The puncture is performed under continuous ultrasound visualization.   The microwire was advanced and the needle was exchanged for  a microsheath.  The J-wire was then advanced and a 6 Fr sheath inserted.  Hand injections were completed to image the access from the arterial anastomosis through the entire access.  The central venous structures were also imaged by hand injections.  Diagnostic interpretation: Approximately 5 cm beyond the arterial anastomosis in the cephalic vein outflow there is a string sign greater than 90% stenosis.  Proximal to this the vein dilates and appears to be 10 mm in diameter.  The cephalic vein remains widely patent all the way up the arm.  The cephalic subclavian confluence is widely patent.  Subclavian vein is widely patent.  Initially images suggested a stenosis of the innominate vein however once full flow would been restored and repeat imaging of the innominate was performed there does not appear to be a stenosis of the innominate vein.  Superior vena cava is widely patent.  Based on the images,  3000 units of heparin was given and a wire was negotiated through the strictures within the venous portion of the graft.  Initially a 6 mm x 40 mm Ultraverse balloon was used to angioplasty the string sign however this did not fully expand  and I elected to advance first a 3 mm x 40 mm Dorado inflated to 16 atm for 1 minute.  Next a 5 mm x 40 mm Dorado balloon was inflated across this lesion to 16 atm for approximately 1 minute.  Following this, a 6 mm x 50 mm Viabahn was deployed across the stenoses and postdilated with an 6 mm x 40 mm Ultraverse balloon.  Follow-up imaging demonstrates complete resolution of the stricture, less than 5% residual stenosis, with rapid flow of contrast through the graft,  the central venous anatomy is preserved.  A 4-0 Monocryl purse-string suture was sewn around the sheath.  The sheath was removed and light pressure was applied.  A sterile bandage was applied to the puncture site.    COMPLICATIONS: None  CONDITION: Carlynn Purl, M.D [facility] Office: [phone]  12/13/2020 6:00 PM

## 2020-12-14 ENCOUNTER — Encounter: Payer: Self-pay | Admitting: Vascular Surgery

## 2020-12-29 ENCOUNTER — Other Ambulatory Visit (INDEPENDENT_AMBULATORY_CARE_PROVIDER_SITE_OTHER): Payer: Self-pay | Admitting: Vascular Surgery

## 2020-12-29 DIAGNOSIS — Z95820 Peripheral vascular angioplasty status with implants and grafts: Secondary | ICD-10-CM

## 2021-01-02 ENCOUNTER — Other Ambulatory Visit: Payer: Self-pay

## 2021-01-02 ENCOUNTER — Ambulatory Visit (INDEPENDENT_AMBULATORY_CARE_PROVIDER_SITE_OTHER): Payer: Medicare Other

## 2021-01-02 ENCOUNTER — Encounter (INDEPENDENT_AMBULATORY_CARE_PROVIDER_SITE_OTHER): Payer: Self-pay | Admitting: Vascular Surgery

## 2021-01-02 ENCOUNTER — Ambulatory Visit (INDEPENDENT_AMBULATORY_CARE_PROVIDER_SITE_OTHER): Payer: Medicare Other | Admitting: Vascular Surgery

## 2021-01-02 VITALS — BP 151/78 | HR 81 | Ht 71.0 in | Wt 251.0 lb

## 2021-01-02 DIAGNOSIS — N186 End stage renal disease: Secondary | ICD-10-CM | POA: Diagnosis not present

## 2021-01-02 DIAGNOSIS — I2510 Atherosclerotic heart disease of native coronary artery without angina pectoris: Secondary | ICD-10-CM

## 2021-01-02 DIAGNOSIS — I10 Essential (primary) hypertension: Secondary | ICD-10-CM

## 2021-01-02 DIAGNOSIS — Z95820 Peripheral vascular angioplasty status with implants and grafts: Secondary | ICD-10-CM | POA: Diagnosis not present

## 2021-01-02 DIAGNOSIS — T829XXD Unspecified complication of cardiac and vascular prosthetic device, implant and graft, subsequent encounter: Secondary | ICD-10-CM | POA: Diagnosis not present

## 2021-01-02 DIAGNOSIS — E1129 Type 2 diabetes mellitus with other diabetic kidney complication: Secondary | ICD-10-CM

## 2021-01-02 NOTE — Progress Notes (Signed)
MRN : [id number]  Tyrone Campbell is a 70 y.o. (06/13/1951) male who presents with chief complaint of  Chief Complaint  Patient presents with  . Follow-up    2 wk [facility] post A/V fistulagram. hda  .  History of Present Illness:   The patient returns to the office for followup status post intervention of the dialysis access on 12/13/2020.  Following the intervention the access function has significantly improved, with better flow rates and improved KT/V. The patient has not been experiencing increased bleeding times following decannulation and the patient denies increased recirculation. The patient denies an increase in arm swelling. At the present time the patient denies hand pain.  The patient denies amaurosis fugax or recent TIA symptoms. There are no recent neurological changes noted. The patient denies claudication symptoms or rest pain symptoms. The patient denies history of DVT, PE or superficial thrombophlebitis. The patient denies recent episodes of angina or shortness of breath.   Duplex ultrasound of the right radiocephalic fistula demonstrates uniform flows.  The recently placed stent is widely patent.  Flow volumes on today's study are 500 cc/min      Current Meds  Medication Sig  . aspirin EC 81 MG tablet Take 81 mg by mouth every morning. (1100) Swallow whole.  . calcitRIOL (ROCALTROL) 0.25 MCG capsule Take 0.25 mcg by mouth every Tuesday, Thursday, and Saturday at 6 PM. (1100)  . cinacalcet (SENSIPAR) 60 MG tablet Take 120 mg by mouth every Tuesday, Thursday, and Saturday at 6 PM. (1100)  . clopidogrel (PLAVIX) 75 MG tablet Take 75 mg by mouth in the morning. (1100)  . furosemide (LASIX) 20 MG tablet Take 20 mg by mouth in the morning. (1100)  . Insulin Glargine (BASAGLAR KWIKPEN) 100 UNIT/ML SOPN Inject 32 Units into the skin in the morning. (1100--after dialysis)  . insulin lispro (HUMALOG) 100 UNIT/ML KwikPen Inject 10 Units into the skin daily before supper.   .  lidocaine-prilocaine (EMLA) cream Apply 1 application topically 3 (three) times a week. Prior to port being accessed  . sevelamer (RENAGEL) 800 MG tablet Take 800-2,400 mg by mouth See admin instructions. Take 3 tablets (2400 mg) by mouth with each meal & take 1 tablet (800 mg) by mouth with each snack  . simvastatin (ZOCOR) 20 MG tablet Take 20 mg by mouth in the morning. (1100)  . tamsulosin (FLOMAX) 0.4 MG CAPS capsule Take 0.4 mg by mouth 2 (two) times daily.   Current Facility-Administered Medications for the 01/02/21 encounter (Office Visit) with Delana Meyer, Dolores Lory, MD  Medication  . vancomycin (VANCOCIN) 1,000 mg in sodium chloride 0.9 % 500 mL IVPB    Past Medical History:  Diagnosis Date  . Chronic kidney disease    stage 4  . Colon cancer ([place]) 02-06-16   INVASIVE COLORECTAL ADENOCARCINOMA, MODERATELY DIFFERENTIATED  . Colon polyp 02-06-16   TUBULAR ADENOMA and INVASIVE COLORECTAL ADENOCARCINOMA, MODERATELY DIFFERENTIATED  . Coronary artery disease   . Diabetes mellitus without complication ([place])   . Hyperlipemia   . Hypertension   . Polycystic kidney disease   . Ventral hernia     Past Surgical History:  Procedure Laterality Date  . A/V FISTULAGRAM Right 12/13/2020   Procedure: A/V FISTULAGRAM;  Surgeon: Katha Cabal, MD;  Location: [facility];  Service: Cardiovascular;  Laterality: Right;  . AV FISTULA PLACEMENT Right 09/12/2018   Procedure: ARTERIOVENOUS (AV) FISTULA CREATION ( RADIOCEPHALIC );  Surgeon: Katha Cabal, MD;  Location: [facility] ORS;  Service: Vascular;  Laterality: Right;  . COLONOSCOPY WITH PROPOFOL N/A 02/06/2016   Procedure: COLONOSCOPY WITH PROPOFOL;  Surgeon: Lollie Sails, MD;  Location: [facility] ENDOSCOPY;  Service: Endoscopy;  Laterality: N/A;  . COLONOSCOPY WITH PROPOFOL N/A 07/17/2017   Procedure: COLONOSCOPY WITH PROPOFOL;  Surgeon: Toledo, Benay Pike, MD;  Location: [facility] ENDOSCOPY;  Service: Gastroenterology;  Laterality: N/A;  .  CORONARY ANGIOPLASTY    . CORONARY ARTERY BYPASS GRAFT  Jan 2010   [facility]  . FRACTURE SURGERY     left arm  . HERNIA REPAIR  1999   Dr Bary Castilla  umbilical  . MENISCUS REPAIR Right 2014  . RECTAL EUS  03/01/2016  . TONSILLECTOMY      Social History Social History   Tobacco Use  . Smoking status: Former Smoker    Packs/day: 0.25    Years: 2.00    Pack years: 0.50    Types: Cigarettes    Quit date: 02/28/1975    Years since quitting: 45.8  . Smokeless tobacco: Never Used  Vaping Use  . Vaping Use: Never used  Substance Use Topics  . Alcohol use: Yes    Alcohol/week: 0.0 standard drinks    Comment: OCCASIONAL/ none in last year   . Drug use: No    Family History Family History  Problem Relation Age of Onset  . Stroke Father   . Polycystic kidney disease Mother     No Known Allergies   REVIEW OF SYSTEMS (Negative unless checked)  Constitutional: [] Weight loss  [] Fever  [] Chills Cardiac: [] Chest pain   [] Chest pressure   [] Palpitations   [] Shortness of breath when laying flat   [] Shortness of breath with exertion. Vascular:  [] Pain in legs with walking   [] Pain in legs at rest  [] History of DVT   [] Phlebitis   [] Swelling in legs   [] Varicose veins   [] Non-healing ulcers Pulmonary:   [] Uses home oxygen   [] Productive cough   [] Hemoptysis   [] Wheeze  [] COPD   [] Asthma Neurologic:  [] Dizziness   [] Seizures   [] History of stroke   [] History of TIA  [] Aphasia   [] Vissual changes   [] Weakness or numbness in arm   [] Weakness or numbness in leg Musculoskeletal:   [] Joint swelling   [] Joint pain   [] Low back pain Hematologic:  [] Easy bruising  [] Easy bleeding   [] Hypercoagulable state   [] Anemic Gastrointestinal:  [] Diarrhea   [] Vomiting  [] Gastroesophageal reflux/heartburn   [] Difficulty swallowing. Genitourinary:  [x] Chronic kidney disease   [] Difficult urination  [] Frequent urination   [] Blood in urine Skin:  [] Rashes   [] Ulcers  Psychological:  [] History of anxiety   []   History of major depression.  Physical Examination  Vitals:   01/02/21 1124  BP: (!) 151/78  Pulse: 81  Weight: 251 lb (113.9 kg)  Height: 5\' 11"  (1.803 m)   Body mass index is 35.01 kg/m. Gen: WD/WN, NAD Head: [place]/AT, No temporalis wasting.  Ear/Nose/Throat: Hearing grossly intact, nares w/o erythema or drainage Eyes: PER, EOMI, sclera nonicteric.  Neck: Supple, no large masses.   Pulmonary:  Good air movement, no audible wheezing bilaterally, no use of accessory muscles.  Cardiac: RRR, no JVD Vascular:  Right wrist +thrill and bruit Vessel Right Left  Radial Palpable Palpable  Brachial Palpable Palpable  Gastrointestinal: Non-distended. No guarding/no peritoneal signs.  Musculoskeletal: M/S 5/5 throughout.  No deformity or atrophy.  Neurologic: CN 2-12 intact. Symmetrical.  Speech is fluent. Motor exam as listed above. Psychiatric: Judgment intact, Mood &  affect appropriate for pt's clinical situation. Dermatologic: No rashes or ulcers noted.  No changes consistent with cellulitis.   CBC Lab Results  Component Value Date   WBC 5.8 09/05/2018   HGB 10.6 (L) 09/05/2018   HCT 31.9 (L) 09/05/2018   MCV 87.4 09/05/2018   PLT 169 09/05/2018    BMET    Component Value Date/Time   NA 139 09/05/2018 1510   K 4.4 09/05/2018 1510   CL 106 09/05/2018 1510   CO2 23 09/05/2018 1510   GLUCOSE 116 (H) 09/05/2018 1510   BUN 97 (H) 09/05/2018 1510   CREATININE 8.61 (H) 09/05/2018 1510   CALCIUM 8.5 (L) 09/05/2018 1510   GFRNONAA 6 (L) 09/05/2018 1510   GFRAA 7 (L) 09/05/2018 1510   CrCl cannot be calculated (Patient's most recent lab result is older than the maximum 21 days allowed.).  COAG Lab Results  Component Value Date   INR 1.15 09/05/2018    Radiology PERIPHERAL VASCULAR CATHETERIZATION  Result Date: 12/13/2020 See Op Note    Assessment/Plan 1. Complication of vascular access for dialysis, subsequent encounter Recommend:  The patient is doing well and  currently has adequate dialysis access. The patient's dialysis center is not reporting any access issues. Flow pattern is stable when compared to the prior ultrasound.  The patient should have a duplex ultrasound of the dialysis access in  Months.  The patient will follow-up with me in the office after each ultrasound   - VAS [place] (AVF, AVG); Future  2. End stage renal disease ([place]) At the present time the patient has adequate dialysis access.  Continue hemodialysis as ordered without interruption.  Avoid nephrotoxic medications and dehydration.  Further plans per nephrology  3. Essential hypertension Continue antihypertensive medications as already ordered, these medications have been reviewed and there are no changes at this time.   4. Atherosclerosis of native coronary artery of native heart without angina pectoris Continue cardiac and antihypertensive medications as already ordered and reviewed, no changes at this time.  Continue statin as ordered and reviewed, no changes at this time  Nitrates PRN for chest pain   5. Type 2 diabetes mellitus with other diabetic kidney complication (HCC) Continue hypoglycemic medications as already ordered, these medications have been reviewed and there are no changes at this time.  Hgb A1C to be monitored as already arranged by primary service     Hortencia Pilar, MD  01/02/2021 11:34 AM

## 2021-01-04 ENCOUNTER — Encounter (INDEPENDENT_AMBULATORY_CARE_PROVIDER_SITE_OTHER): Payer: Medicare Other

## 2021-01-04 ENCOUNTER — Ambulatory Visit (INDEPENDENT_AMBULATORY_CARE_PROVIDER_SITE_OTHER): Payer: Medicare Other | Admitting: Nurse Practitioner

## 2021-05-22 DIAGNOSIS — Z905 Acquired absence of kidney: Secondary | ICD-10-CM | POA: Insufficient documentation

## 2021-05-22 DIAGNOSIS — K439 Ventral hernia without obstruction or gangrene: Secondary | ICD-10-CM | POA: Insufficient documentation

## 2021-06-12 DIAGNOSIS — L7631 Postprocedural hematoma of skin and subcutaneous tissue following a dermatologic procedure: Secondary | ICD-10-CM | POA: Insufficient documentation

## 2021-07-02 NOTE — Progress Notes (Signed)
MRN : [id number]  Tyrone Campbell is a 70 y.o. (14-Feb-1951) male who presents with chief complaint of check access.  History of Present Illness:   The patient returns to the office for followup status post intervention of the dialysis access on 12/13/2020.  Following the intervention the access function has significantly improved, with better flow rates and improved KT/V. The patient has not been experiencing increased bleeding times following decannulation and the patient denies increased recirculation. The patient denies an increase in arm swelling. At the present time the patient denies hand pain.   The patient denies amaurosis fugax or recent TIA symptoms. There are no recent neurological changes noted. The patient denies claudication symptoms or rest pain symptoms. The patient denies history of DVT, PE or superficial thrombophlebitis. The patient denies recent episodes of angina or shortness of breath.    Duplex ultrasound of the right radiocephalic fistula demonstrates uniform flows.  The recently placed stent is widely patent.  Flow volumes on today's study are 1881 cc/min.  No outpatient medications have been marked as taking for the 07/03/21 encounter (Appointment) with Delana Meyer, Dolores Lory, MD.   Current Facility-Administered Medications for the 07/03/21 encounter (Appointment) with Delana Meyer, Dolores Lory, MD  Medication   vancomycin (VANCOCIN) 1,000 mg in sodium chloride 0.9 % 500 mL IVPB    Past Medical History:  Diagnosis Date   Chronic kidney disease    stage 4   Colon cancer ([place]) 02-06-16   INVASIVE COLORECTAL ADENOCARCINOMA, MODERATELY DIFFERENTIATED   Colon polyp 02-06-16   TUBULAR ADENOMA and INVASIVE COLORECTAL ADENOCARCINOMA, MODERATELY DIFFERENTIATED   Coronary artery disease    Diabetes mellitus without complication ([place])    Hyperlipemia    Hypertension    Polycystic kidney disease    Ventral hernia     Past Surgical History:  Procedure Laterality Date   A/V  FISTULAGRAM Right 12/13/2020   Procedure: A/V FISTULAGRAM;  Surgeon: Katha Cabal, MD;  Location: [place] [facility];  Service: Cardiovascular;  Laterality: Right;   AV FISTULA PLACEMENT Right 09/12/2018   Procedure: ARTERIOVENOUS (AV) FISTULA CREATION ( RADIOCEPHALIC );  Surgeon: Katha Cabal, MD;  Location: [facility] ORS;  Service: Vascular;  Laterality: Right;   COLONOSCOPY WITH PROPOFOL N/A 02/06/2016   Procedure: COLONOSCOPY WITH PROPOFOL;  Surgeon: Lollie Sails, MD;  Location: [facility] ENDOSCOPY;  Service: Endoscopy;  Laterality: N/A;   COLONOSCOPY WITH PROPOFOL N/A 07/17/2017   Procedure: COLONOSCOPY WITH PROPOFOL;  Surgeon: Toledo, Benay Pike, MD;  Location: [facility] ENDOSCOPY;  Service: Gastroenterology;  Laterality: N/A;   CORONARY ANGIOPLASTY     CORONARY ARTERY BYPASS GRAFT  Jan 2010   [facility]   FRACTURE SURGERY     left arm   HERNIA REPAIR  1999   Dr Bary Castilla  umbilical   MENISCUS REPAIR Right 2014   RECTAL EUS  03/01/2016   TONSILLECTOMY      Social History Social History   Tobacco Use   Smoking status: Former    Packs/day: 0.25    Years: 2.00    Pack years: 0.50    Types: Cigarettes    Quit date: 02/28/1975    Years since quitting: 46.3   Smokeless tobacco: Never  Vaping Use   Vaping Use: Never used  Substance Use Topics   Alcohol use: Yes    Alcohol/week: 0.0 standard drinks    Comment: OCCASIONAL/ none in last year    Drug use: No    Family History Family History  Problem Relation Age  of Onset   Stroke Father    Polycystic kidney disease Mother     No Known Allergies   REVIEW OF SYSTEMS (Negative unless checked)  Constitutional: [] Weight loss  [] Fever  [] Chills Cardiac: [] Chest pain   [] Chest pressure   [] Palpitations   [] Shortness of breath when laying flat   [] Shortness of breath with exertion. Vascular:  [] Pain in legs with walking   [] Pain in legs at rest  [] History of DVT   [] Phlebitis   [] Swelling in legs   [] Varicose veins   [] Non-healing  ulcers Pulmonary:   [] Uses home oxygen   [] Productive cough   [] Hemoptysis   [] Wheeze  [] COPD   [] Asthma Neurologic:  [] Dizziness   [] Seizures   [] History of stroke   [] History of TIA  [] Aphasia   [] Vissual changes   [] Weakness or numbness in arm   [] Weakness or numbness in leg Musculoskeletal:   [] Joint swelling   [] Joint pain   [] Low back pain Hematologic:  [] Easy bruising  [] Easy bleeding   [] Hypercoagulable state   [] Anemic Gastrointestinal:  [] Diarrhea   [] Vomiting  [] Gastroesophageal reflux/heartburn   [] Difficulty swallowing. Genitourinary:  [x] Chronic kidney disease   [] Difficult urination  [] Frequent urination   [] Blood in urine Skin:  [] Rashes   [] Ulcers  Psychological:  [] History of anxiety   []  History of major depression.  Physical Examination  There were no vitals filed for this visit. There is no height or weight on file to calculate BMI. Gen: WD/WN, NAD Head: [place]/AT, No temporalis wasting.  Ear/Nose/Throat: Hearing grossly intact, nares w/o erythema or drainage Eyes: PER, EOMI, sclera nonicteric.  Neck: Supple, no gross masses or lesions.  No JVD.  Pulmonary:  Good air movement, no audible wheezing, no use of accessory muscles.  Cardiac: RRR, precordium non-hyperdynamic. Vascular:    right AV fistula with good thrill and good bruit  skin intact Vessel Right Left  Radial Palpable Palpable  Brachial Palpable Palpable  Gastrointestinal: soft, non-distended. No guarding/no peritoneal signs.  Musculoskeletal: M/S 5/5 throughout.  No deformity.  Neurologic: CN 2-12 intact. Pain and light touch intact in extremities.  Symmetrical.  Speech is fluent. Motor exam as listed above. Psychiatric: Judgment intact, Mood & affect appropriate for pt's clinical situation. Dermatologic: No rashes or ulcers noted.  No changes consistent with cellulitis.   CBC Lab Results  Component Value Date   WBC 5.8 09/05/2018   HGB 10.6 (L) 09/05/2018   HCT 31.9 (L) 09/05/2018   MCV 87.4  09/05/2018   PLT 169 09/05/2018    BMET    Component Value Date/Time   NA 139 09/05/2018 1510   K 4.4 09/05/2018 1510   CL 106 09/05/2018 1510   CO2 23 09/05/2018 1510   GLUCOSE 116 (H) 09/05/2018 1510   BUN 97 (H) 09/05/2018 1510   CREATININE 8.61 (H) 09/05/2018 1510   CALCIUM 8.5 (L) 09/05/2018 1510   GFRNONAA 6 (L) 09/05/2018 1510   GFRAA 7 (L) 09/05/2018 1510   CrCl cannot be calculated (Patient's most recent lab result is older than the maximum 21 days allowed.).  COAG Lab Results  Component Value Date   INR 1.15 09/05/2018    Radiology No results found.   Assessment/Plan 1. End stage renal disease ([place]) Recommend:  The patient is doing well and currently has adequate dialysis access. The patient's dialysis center is not reporting any access issues. Flow pattern is stable when compared to the prior ultrasound.  The patient should have a duplex ultrasound of the dialysis  access in 6 months. The patient will follow-up with me in the office after each ultrasound    - VAS [place] (AVF, AVG); Future  2. Complication of vascular access for dialysis, subsequent encounter Recommend:  The patient is doing well and currently has adequate dialysis access. The patient's dialysis center is not reporting any access issues. Flow pattern is stable when compared to the prior ultrasound.  The patient should have a duplex ultrasound of the dialysis access in 6 months. The patient will follow-up with me in the office after each ultrasound    - VAS [place] (AVF, AVG); Future  3. Essential hypertension Continue antihypertensive medications as already ordered, these medications have been reviewed and there are no changes at this time.   4. Atherosclerosis of native coronary artery of native heart without angina pectoris Continue cardiac and antihypertensive medications as already ordered and reviewed, no changes at this time.  Continue  statin as ordered and reviewed, no changes at this time  Nitrates PRN for chest pain   5. Type 2 diabetes mellitus with other diabetic kidney complication (HCC) Continue hypoglycemic medications as already ordered, these medications have been reviewed and there are no changes at this time.  Hgb A1C to be monitored as already arranged by primary service    Hortencia Pilar, MD  07/02/2021 9:00 PM

## 2021-07-03 ENCOUNTER — Ambulatory Visit (INDEPENDENT_AMBULATORY_CARE_PROVIDER_SITE_OTHER): Payer: Medicare Other

## 2021-07-03 ENCOUNTER — Ambulatory Visit (INDEPENDENT_AMBULATORY_CARE_PROVIDER_SITE_OTHER): Payer: Medicare Other | Admitting: Vascular Surgery

## 2021-07-03 ENCOUNTER — Other Ambulatory Visit: Payer: Self-pay

## 2021-07-03 VITALS — BP 161/79 | HR 90 | Ht 71.0 in | Wt 249.0 lb

## 2021-07-03 DIAGNOSIS — N186 End stage renal disease: Secondary | ICD-10-CM | POA: Diagnosis not present

## 2021-07-03 DIAGNOSIS — T829XXD Unspecified complication of cardiac and vascular prosthetic device, implant and graft, subsequent encounter: Secondary | ICD-10-CM

## 2021-07-03 DIAGNOSIS — I10 Essential (primary) hypertension: Secondary | ICD-10-CM

## 2021-07-03 DIAGNOSIS — E1129 Type 2 diabetes mellitus with other diabetic kidney complication: Secondary | ICD-10-CM

## 2021-07-03 DIAGNOSIS — I2510 Atherosclerotic heart disease of native coronary artery without angina pectoris: Secondary | ICD-10-CM | POA: Diagnosis not present

## 2021-07-14 ENCOUNTER — Encounter (INDEPENDENT_AMBULATORY_CARE_PROVIDER_SITE_OTHER): Payer: Self-pay | Admitting: Vascular Surgery

## 2021-10-18 ENCOUNTER — Ambulatory Visit (INDEPENDENT_AMBULATORY_CARE_PROVIDER_SITE_OTHER): Payer: Medicare Other | Admitting: Nurse Practitioner

## 2021-10-18 ENCOUNTER — Encounter (INDEPENDENT_AMBULATORY_CARE_PROVIDER_SITE_OTHER): Payer: Medicare Other

## 2021-10-27 ENCOUNTER — Telehealth (INDEPENDENT_AMBULATORY_CARE_PROVIDER_SITE_OTHER): Payer: Self-pay | Admitting: *Deleted

## 2021-10-27 NOTE — Telephone Encounter (Signed)
Patient called in reference to a referral sent to our office by [facility] and he advised he does not want to be scheduled and to cancel any future appointments.

## 2021-12-04 ENCOUNTER — Ambulatory Visit (INDEPENDENT_AMBULATORY_CARE_PROVIDER_SITE_OTHER): Payer: Medicare Other | Admitting: Nurse Practitioner

## 2021-12-04 ENCOUNTER — Encounter (INDEPENDENT_AMBULATORY_CARE_PROVIDER_SITE_OTHER): Payer: Medicare Other

## 2021-12-14 ENCOUNTER — Encounter: Payer: Self-pay | Admitting: Urology

## 2021-12-14 ENCOUNTER — Ambulatory Visit (INDEPENDENT_AMBULATORY_CARE_PROVIDER_SITE_OTHER): Payer: Medicare Other | Admitting: Urology

## 2021-12-14 ENCOUNTER — Other Ambulatory Visit: Payer: Self-pay

## 2021-12-14 VITALS — BP 144/77 | HR 85 | Ht 71.0 in | Wt 234.0 lb

## 2021-12-14 DIAGNOSIS — R3915 Urgency of urination: Secondary | ICD-10-CM

## 2021-12-14 DIAGNOSIS — R369 Urethral discharge, unspecified: Secondary | ICD-10-CM | POA: Diagnosis not present

## 2021-12-14 NOTE — Progress Notes (Signed)
? ?12/14/2021 ?6:47 PM  ? ?[place] ?1950-11-18 ?[id number] ? ?Referring provider: Baxter Hire, MD ?[place] ?[place],  [place] [address] ? ?Chief Complaint  ?Patient presents with  ? Penile Discharge  ? ? ?HPI: ?Tyrone Campbell is a 71 y.o. male referred for evaluation of urgency, urethral pain. ? ?History polycystic kidney disease status post bilateral nephrectomy/ventral hernia repair 05/12/2021 at [facility] ?Approximately 2 weeks ago he was complaining of penile discharge with associated urgency.  Was treated with a 10-day course of Cipro however had to stop after 7 days secondary to a rash ?He states his symptoms have improved.  He is also had lower abdominal pain and is scheduled for a CT of the abdomen pelvis at [facility] on 12/17/2021 ?Denies blood per urethra ? ? ?PMH: ?Past Medical History:  ?Diagnosis Date  ? Chronic kidney disease   ? stage 4  ? Colon cancer ([place]) 02-06-16  ? INVASIVE COLORECTAL ADENOCARCINOMA, MODERATELY DIFFERENTIATED  ? Colon polyp 02-06-16  ? TUBULAR ADENOMA and INVASIVE COLORECTAL ADENOCARCINOMA, MODERATELY DIFFERENTIATED  ? Coronary artery disease   ? Diabetes mellitus without complication ([place])   ? Hyperlipemia   ? Hypertension   ? Polycystic kidney disease   ? Ventral hernia   ? ? ?Surgical History: ?Past Surgical History:  ?Procedure Laterality Date  ? A/V FISTULAGRAM Right 12/13/2020  ? Procedure: A/V FISTULAGRAM;  Surgeon: Katha Cabal, MD;  Location: [place] [facility];  Service: Cardiovascular;  Laterality: Right;  ? AV FISTULA PLACEMENT Right 09/12/2018  ? Procedure: ARTERIOVENOUS (AV) FISTULA CREATION ( RADIOCEPHALIC );  Surgeon: Katha Cabal, MD;  Location: [facility] ORS;  Service: Vascular;  Laterality: Right;  ? COLONOSCOPY WITH PROPOFOL N/A 02/06/2016  ? Procedure: COLONOSCOPY WITH PROPOFOL;  Surgeon: Lollie Sails, MD;  Location: [facility] ENDOSCOPY;  Service: Endoscopy;  Laterality: N/A;  ? COLONOSCOPY WITH PROPOFOL N/A 07/17/2017  ? Procedure: COLONOSCOPY  WITH PROPOFOL;  Surgeon: Toledo, Benay Pike, MD;  Location: [facility] ENDOSCOPY;  Service: Gastroenterology;  Laterality: N/A;  ? CORONARY ANGIOPLASTY    ? CORONARY ARTERY BYPASS GRAFT  Jan 2010  ? [facility]  ? FRACTURE SURGERY    ? left arm  ? HERNIA REPAIR  1999  ? Dr Bary Castilla  umbilical  ? MENISCUS REPAIR Right 2014  ? RECTAL EUS  03/01/2016  ? TONSILLECTOMY    ? ? ?Home Medications:  ?Allergies as of 12/14/2021   ? ?   Reactions  ? Ciprofloxacin Hives  ? ?  ? ?  ?Medication List  ?  ? ?  ? Accurate as of December 14, 2021  6:47 PM. If you have any questions, ask your nurse or doctor.  ?  ?  ? ?  ? ?STOP taking these medications   ? ?furosemide 20 MG tablet ?Commonly known as: LASIX ?Stopped by: Abbie Sons, MD ?  ?tamsulosin 0.4 MG Caps capsule ?Commonly known as: FLOMAX ?Stopped by: Abbie Sons, MD ?  ? ?  ? ?TAKE these medications   ? ?Accu-Chek Guide test strip ?Generic drug: glucose blood ?2 (two) times daily. as directed ?  ?amoxicillin-clavulanate 875-125 MG tablet ?Commonly known as: AUGMENTIN ?Take 1 tablet by mouth 2 (two) times daily. ?  ?aspirin EC 81 MG tablet ?Take 81 mg by mouth every morning. (1100) Swallow whole. ?  ?Basaglar KwikPen 100 UNIT/ML ?Inject 32 Units into the skin in the morning. (1100--after dialysis) ?  ?calcitRIOL 0.25 MCG capsule ?Commonly known as: ROCALTROL ?Take 0.25 mcg  by mouth every Tuesday, Thursday, and Saturday at 6 PM. (1100) ?  ?cinacalcet 60 MG tablet ?Commonly known as: SENSIPAR ?Take 120 mg by mouth every Tuesday, Thursday, and Saturday at 6 PM. (1100) ?  ?clopidogrel 75 MG tablet ?Commonly known as: PLAVIX ?Take 75 mg by mouth in the morning. (1100) ?  ?insulin lispro 100 UNIT/ML KwikPen ?Commonly known as: HUMALOG ?Inject 10 Units into the skin daily before supper. ?  ?lidocaine-prilocaine cream ?Commonly known as: EMLA ?Apply 1 application topically 3 (three) times a week. Prior to port being accessed ?  ?sevelamer 800 MG tablet ?Commonly known as: RENAGEL ?Take 800-2,400  mg by mouth See admin instructions. Take 3 tablets (2400 mg) by mouth with each meal & take 1 tablet (800 mg) by mouth with each snack ?  ?simvastatin 20 MG tablet ?Commonly known as: ZOCOR ?Take 20 mg by mouth in the morning. (1100) ?  ? ?  ? ? ?Allergies:  ?Allergies  ?Allergen Reactions  ? Ciprofloxacin Hives  ? ? ?Family History: ?Family History  ?Problem Relation Age of Onset  ? Stroke Father   ? Polycystic kidney disease Mother   ? ? ?Social History:  reports that he quit smoking about 46 years ago. His smoking use included cigarettes. He has a 0.50 pack-year smoking history. He has never used smokeless tobacco. He reports current alcohol use. He reports that he does not use drugs. ? ? ?Physical Exam: ?BP (!) 144/77   Pulse 85   Ht '5\' 11"'$  (1.803 m)   Wt 234 lb (106.1 kg)   BMI 32.64 kg/m?   ?Constitutional:  Alert and oriented, No acute distress. ?Respiratory: Normal respiratory effort, no increased work of breathing. ?GI: Abdomen is soft, nontender ?Psychiatric: Normal mood and affect. ? ?Assessment & Plan:   ?71 y.o. anephric male with recent episode of urgency and penile discharge ?Was treated with antibiotics with symptom improvement ?Scheduled for CT later this week for lower abdominal pain. If CT neg recc scheduling cystoscopy. Feel visualization would be suboptimal w/ flexible office cystoscopy and would schedule rigid cysto under anesthesia/sedation  ? ? ? ?Abbie Sons, MD ?[place] ?[address], [address] ?[place], [place] [address] ?[phone] ? ?

## 2021-12-31 ENCOUNTER — Encounter: Payer: Self-pay | Admitting: Urology

## 2022-03-19 ENCOUNTER — Telehealth: Payer: Self-pay | Admitting: Urology

## 2022-03-19 NOTE — Telephone Encounter (Signed)
I had requested patient's schedule a follow-up appointment for his penile discharge and urgency.  He states his symptoms have resolved without recurrence and refused follow-up

## 2022-07-30 ENCOUNTER — Encounter (INDEPENDENT_AMBULATORY_CARE_PROVIDER_SITE_OTHER): Payer: Self-pay
